# Patient Record
Sex: Male | Born: 1995 | Race: White | Hispanic: No | Marital: Married | Smoking: Former smoker
Health system: Southern US, Community
[De-identification: ages and names within clinical notes are randomized; demographics above are authoritative.]

## PROBLEM LIST (undated history)

## (undated) ENCOUNTER — Ambulatory Visit: Admission: EM

## (undated) DIAGNOSIS — D6851 Activated protein C resistance: Secondary | ICD-10-CM

## (undated) DIAGNOSIS — M5126 Other intervertebral disc displacement, lumbar region: Secondary | ICD-10-CM

## (undated) DIAGNOSIS — M4310 Spondylolisthesis, site unspecified: Secondary | ICD-10-CM

## (undated) DIAGNOSIS — H9190 Unspecified hearing loss, unspecified ear: Secondary | ICD-10-CM

## (undated) DIAGNOSIS — I10 Essential (primary) hypertension: Secondary | ICD-10-CM

## (undated) DIAGNOSIS — L409 Psoriasis, unspecified: Secondary | ICD-10-CM

## (undated) DIAGNOSIS — H9319 Tinnitus, unspecified ear: Secondary | ICD-10-CM

## (undated) HISTORY — PX: WISDOM TOOTH EXTRACTION: SHX21

## (undated) HISTORY — PX: SEPTOPLASTY: SUR1290

## (undated) HISTORY — PX: BACK SURGERY: SHX140

---

## 2019-07-18 ENCOUNTER — Ambulatory Visit
Admission: EM | Admit: 2019-07-18 | Discharge: 2019-07-18 | Disposition: A | Discharge status: Home / Self Care | Attending: Emergency Medicine | Admitting: Emergency Medicine

## 2019-07-18 DIAGNOSIS — S61210A Laceration without foreign body of right index finger without damage to nail, initial encounter: Secondary | ICD-10-CM

## 2019-07-18 MED ORDER — IBUPROFEN 600 MG PO TABS
600.0000 mg | ORAL_TABLET | Freq: Four times a day (QID) | ORAL | 0 refills | Status: AC | PRN
Start: 2019-07-18 — End: ?

## 2019-07-18 MED ORDER — BACITRACIN ZINC 500 UNIT/GM EX OINT: 1.0000 "application " | TOPICAL_OINTMENT | Freq: Once | CUTANEOUS | Status: DC

## 2019-07-18 MED ORDER — CEPHALEXIN 500 MG PO CAPS: 1000.0000 mg | ORAL_CAPSULE | Freq: Two times a day (BID) | ORAL | 0 refills | Status: AC

## 2019-07-18 NOTE — ED Provider Notes (Signed)
HPI  SUBJECTIVE:  Gabriel Rodriguez is a right handed  24 y.o. male who presents with a 1 cm laceration to the right index finger PIP sustained earlier today.  States that he was cleaning an aluminum transmission, and cut his finger on it.  States that it was contaminated with oil and organic debris.  He states that there is no rust on it.  No foreign body sensation, he reports mild pain with palpation only.  No limitation of motion of the finger, distal numbness or tingling.  No alleviating factors.  He has not tried anything for this.  Symptoms are worse with palpation.  Past medical history negative for diabetes, smoking.  Tetanus is up-to-date.  PMD: None.    History reviewed. No pertinent past medical history.  History reviewed. No pertinent surgical history.  History reviewed. No pertinent family history.  Social History   Tobacco Use  . Smoking status: Former Games developer  . Smokeless tobacco: Former Clinical biochemist  . Vaping Use: Every day  Substance Use Topics  . Alcohol use: Not on file  . Drug use: Not on file    No current facility-administered medications for this encounter.  Current Outpatient Medications:  .  cephALEXin (KEFLEX) 500 MG capsule, Take 2 capsules (1,000 mg total) by mouth 2 (two) times daily for 5 days., Disp: 20 capsule, Rfl: 0 .  ibuprofen (ADVIL) 600 MG tablet, Take 1 tablet (600 mg total) by mouth every 6 (six) hours as needed., Disp: 30 tablet, Rfl: 0  Not on File   ROS  As noted in HPI.   Physical Exam  BP 132/71 (BP Location: Right Arm)   Pulse 82   Temp 98.2 F (36.8 C) (Oral)   Resp 16   SpO2 100%   Constitutional: Well developed, well nourished, no acute distress Eyes:  EOMI, conjunctiva normal bilaterally HENT: Normocephalic, atraumatic,mucus membranes moist Respiratory: Normal inspiratory effort Cardiovascular: Normal rate GI: nondistended skin: See musculoskeletal exam Musculoskeletal: 1 cm clean linear laceration at the  right index PIP.  No foreign body seen.  Two-point discrimination distally intact.  Patient able to flex PIP and DIP against resistance.  Cap refill less than 2 seconds.        Neurologic: Alert & oriented x 3, no focal neuro deficits Psychiatric: Speech and behavior appropriate   ED Course   Medications - No data to display  Orders Placed This Encounter  Procedures  . Apply finger splint static    Index finger    Standing Status:   Standing    Number of Occurrences:   1    Order Specific Question:   Laterality    Answer:   Right    No results found for this or any previous visit (from the past 24 hour(s)). No results found.  ED Clinical Impression  1. Laceration of right index finger without foreign body without damage to nail, initial encounter      ED Assessment/Plan  Procedure note.  Had patient extensively irrigated wound out with chlorhexidine and tap water.  Then cleaned base of the finger with alcohol.  Using 1.5 cc of 1% plain lidocaine did a digital block with adequate anesthesia.  Placed 3 5-0  Interrupted Prolene sutures with close approximation of wound edges.  Bacitracin, splint placed.  Patient tolerated procedure well.  Tetanus is up-to-date.  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  States that he will be exposed to a lot of solvents.  Discussed with him that it will need to stay in a splint during this time as if he bends his finger he can tear the sutures out.  While the wound was extensively cleaned in the department, pt states it was contaminated with organic material and oil.  Will send home on Keflex 1000 mg p.o. twice daily for 5 days.  Work note for 2 days.  Primary care list for routine care.  Return here in 12 days for suture removal, sooner for any signs of infection.  Discussed  MDM, treatment plan, and plan for follow-up with patient. Discussed sn/sx that should prompt return to the ED. patient agrees with  plan.   Meds ordered this encounter  Medications  . cephALEXin (KEFLEX) 500 MG capsule    Sig: Take 2 capsules (1,000 mg total) by mouth 2 (two) times daily for 5 days.    Dispense:  20 capsule    Refill:  0  . ibuprofen (ADVIL) 600 MG tablet    Sig: Take 1 tablet (600 mg total) by mouth every 6 (six) hours as needed.    Dispense:  30 tablet    Refill:  0    *This clinic note was created using Scientist, clinical (histocompatibility and immunogenetics). Therefore, there may be occasional mistakes despite careful proofreading.   ?    Domenick Gong, MD 07/19/19 [phone]

## 2019-07-18 NOTE — Discharge Instructions (Addendum)
Keep this clean and dry for at least the next 24 to 36 hours.  Return here in 12 days for suture removal.  Finish antibiotics.  May take 600 mg of ibuprofen combined with 1000 mg of Tylenol 3-4 times a day as needed for pain.  Return here sooner for any signs of mild to moderate infection, go to the ER if your entire finger becomes swollen, red, hot, fevers above 100.4, pain not controlled with Tylenol/ibuprofen or for any other concerns.  Here is a list of primary care providers who are taking new patients:  Dr. Elizabeth Sauer [address] [place] [address] [phone]  [facility] at [facility] [address] [place], [place] [address] [phone]  [facility] [place] [address] [place] [address]  [phone]  [facility] [address] [place], [place] [address] [phone]  [facility] [address] [place]  [phone] [place], [place] [address]  Here are clinics/ other resources who will see you if you do not have insurance. Some have certain criteria that you must meet. Call them and find out what they are:  [facility]: [address] [place], [place] [address] Phone: [phone] Hours: First and Third Saturdays of each Month, 9 a.m. - 1 p.m.  [facility]: [address] [place], [place] [address] Phone: [phone] Hours: Tuesday, 4 p.m. - 8 p.m. Thursday, 1 p.m. - 8 p.m. Wednesday, 9 a.m. - [facility] [address], [place], [place] [address] Phone: [phone] Pharmacy Phone Number: [phone] Dental Phone Number: [phone] [facility] Insurance Help: [phone]  Dental Hours: Monday - Thursday, 8 a.m. - 6 p.m.  [facility] [address], [place], [place] [address] Phone: [phone] Pharmacy Phone Number: [phone] [facility] Insurance Help: [phone]  [facility] [address] [place] [place], [place]  [address] Phone: [phone] Pharmacy Phone Number: [phone] [facility] Insurance Help: [phone]  [facility] [address] [place], [place] [address] Phone: [phone] [facility] Insurance Help: [phone]   [facility] [address], [place], [place] [address] Phone: [phone]  Go to www.goodrx.com to look up your medications. This will give you a list of where you can find your prescriptions at the most affordable prices. Or ask the pharmacist what the cash price is, or if they have any other discount programs available to help make your medication more affordable. This can be less expensive than what you would pay with insurance.

## 2019-07-18 NOTE — ED Triage Notes (Signed)
Pt presents to UC for approx 1 cm laceration on right index finger. Lac is on second joint, pt concerned for wound healing r/t finger movement.  Bleeding controlled with bandage at this time.

## 2019-11-20 ENCOUNTER — Ambulatory Visit: Admission: EM | Admit: 2019-11-20 | Discharge: 2019-11-20 | Disposition: A | Discharge status: Home / Self Care

## 2019-11-20 ENCOUNTER — Other Ambulatory Visit: Payer: Self-pay

## 2019-11-20 DIAGNOSIS — H66002 Acute suppurative otitis media without spontaneous rupture of ear drum, left ear: Secondary | ICD-10-CM

## 2019-11-20 HISTORY — DX: Tinnitus, unspecified ear: H93.19

## 2019-11-20 HISTORY — DX: Unspecified hearing loss, unspecified ear: H91.90

## 2019-11-20 HISTORY — DX: Activated protein C resistance: D68.51

## 2019-11-20 HISTORY — DX: Spondylolisthesis, site unspecified: M43.10

## 2019-11-20 MED ORDER — AMOXICILLIN-POT CLAVULANATE 875-125 MG PO TABS
1.0000 | ORAL_TABLET | Freq: Two times a day (BID) | ORAL | 0 refills | Status: AC
Start: 2019-11-20 — End: 2019-11-30

## 2019-11-20 NOTE — ED Triage Notes (Signed)
Pt presents to Urgent Care with c/o tinnitus in L ear x 2 days which is causing headache. 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 Pt also reports rash to elbows and knees intermittently x 2 years.

## 2019-11-20 NOTE — ED Provider Notes (Signed)
[facility]    CSN: [id number] Arrival date & time: 11/20/19  1838      History   Chief Complaint Chief Complaint  Patient presents with   Tinnitus    HPI Gabriel Rodriguez is a 24 y.o. male.   23-year-old male here for evaluation of ringing in his left ear and a headache.  Patient reports his symptoms started 2 days ago and initially consisted of a plugged ear.  He states that the plugged sensation has resolved but he is still having ringing in his left ear.  Initially he was also a little bit off balance but that too has resolved.  Patient denies drainage from his ear, fever, runny nose or nasal congestion, cough, or other cold symptoms.  Patient has not had any recent travel.  Patient does have a history of intermittent tinnitus since serving in the [employer].     Past Medical History:  Diagnosis Date   Factor V Leiden [facility])    Hearing loss    Retrolisthesis    Tinnitus     There are no problems to display for this patient.   History reviewed. No pertinent surgical history.     Home Medications    Prior to Admission medications   Medication Sig Start Date End Date Taking? Authorizing Provider  acetaminophen (TYLENOL) 325 MG tablet Take 650 mg by mouth every 6 (six) hours as needed.   Yes [provider]  amoxicillin-clavulanate (AUGMENTIN) 875-125 MG tablet Take 1 tablet by mouth every 12 (twelve) hours for 10 days. 11/20/19 11/30/19  Becky Augusta, NP  ibuprofen (ADVIL) 600 MG tablet Take 1 tablet (600 mg total) by mouth every 6 (six) hours as needed. 07/18/19   Domenick Gong, MD    Family History Family History  Problem Relation Age of Onset   Thyroid disease Mother     Social History Social History   Tobacco Use   Smoking status: Former Smoker   Smokeless tobacco: Former Forensic psychologist Use: Every day  Substance Use Topics   Alcohol use: Yes    Comment: rarely   Drug use: Never     Allergies     Patient has no known allergies.   Review of Systems Review of Systems  Constitutional: Negative for activity change, appetite change and fever.  HENT: Positive for ear pain. Negative for congestion, ear discharge, postnasal drip, rhinorrhea and sore throat.   Respiratory: Negative for cough, shortness of breath and wheezing.   Cardiovascular: Negative for chest pain.  Gastrointestinal: Negative for diarrhea, nausea and vomiting.  Musculoskeletal: Negative for arthralgias and myalgias.  Skin: Negative for rash.  Neurological: Positive for headaches. Negative for syncope.  Hematological: Negative.   Psychiatric/Behavioral: Negative.      Physical Exam Triage Vital Signs ED Triage Vitals  Enc Vitals Group     BP 11/20/19 1911 (!) 141/95     Pulse Rate 11/20/19 1911 86     Resp 11/20/19 1911 18     Temp 11/20/19 1911 98.6 F (37 C)     Temp Source 11/20/19 1911 Oral     SpO2 11/20/19 1911 98 %     Weight 11/20/19 1909 240 lb (108.9 kg)     Height 11/20/19 1909 5\' 8"  (1.727 m)     Head Circumference --      Peak Flow --      Pain Score 11/20/19 1909 7     Pain Loc --  Pain Edu? --      Excl. in GC? --    No data found.  Updated Vital Signs BP (!) 141/95 (BP Location: Right Arm)    Pulse 86    Temp 98.6 F (37 C) (Oral)    Resp 18    Ht 5\' 8"  (1.727 m)    Wt 240 lb (108.9 kg)    SpO2 98%    BMI 36.49 kg/m   Visual Acuity Right Eye Distance:   Left Eye Distance:   Bilateral Distance:    Right Eye Near:   Left Eye Near:    Bilateral Near:     Physical Exam Vitals and nursing note reviewed.  Constitutional:      General: He is not in acute distress.    Appearance: Normal appearance. He is normal weight. He is not toxic-appearing.  HENT:     Head: Normocephalic and atraumatic.     Right Ear: Tympanic membrane, ear canal and external ear normal.     Left Ear: Ear canal and external ear normal.     Ears:     Comments: Left tympanic membrane is erythematous  and injected. Eyes:     General: No scleral icterus.    Extraocular Movements: Extraocular movements intact.     Conjunctiva/sclera: Conjunctivae normal.     Pupils: Pupils are equal, round, and reactive to light.  Cardiovascular:     Rate and Rhythm: Normal rate and regular rhythm.     Pulses: Normal pulses.     Heart sounds: Normal heart sounds. No murmur heard.  No gallop.   Pulmonary:     Effort: Pulmonary effort is normal.     Breath sounds: Normal breath sounds. No wheezing, rhonchi or rales.  Musculoskeletal:        General: No swelling or tenderness. Normal range of motion.  Skin:    General: Skin is warm and dry.     Capillary Refill: Capillary refill takes less than 2 seconds.     Findings: No erythema or lesion.  Neurological:     General: No focal deficit present.     Mental Status: He is alert and oriented to person, place, and time.  Psychiatric:        Mood and Affect: Mood normal.        Behavior: Behavior normal.        Thought Content: Thought content normal.        Judgment: Judgment normal.      UC Treatments / Results  Labs (all labs ordered are listed, but only abnormal results are displayed) Labs Reviewed - No data to display  EKG   Radiology No results found.  Procedures Procedures (including critical care time)  Medications Ordered in UC Medications - No data to display  Initial Impression / Assessment and Plan / UC Course  I have reviewed the triage vital signs and the nursing notes.  Pertinent labs & imaging results that were available during my care of the patient were reviewed by me and considered in my medical decision making (see chart for details).   Patient for evaluation of ringing in his left ear x2 days.  Patient reports that the ringing is getting to the point where it started because of a headache.  Initially his symptoms started out with muffled type hearing but that is resolved.  Physical exam reveals a injected and  erythematous left tympanic membrane.  Will DC on Augmentin twice daily x10 days.   Final Clinical  Impressions(s) / UC Diagnoses   Final diagnoses:  Non-recurrent acute suppurative otitis media of left ear without spontaneous rupture of tympanic membrane     Discharge Instructions     Take the Augmentin twice daily for 10 days with food.  Use over-the-counter ibuprofen and Tylenol as needed for pain.  You can also use a hot water bottle or heating pad underneath your pillowcase at night to help provide comfort.  Follow-up with your primary care provider for new or worsening symptoms.    ED Prescriptions    Medication Sig Dispense Auth. Provider   amoxicillin-clavulanate (AUGMENTIN) 875-125 MG tablet Take 1 tablet by mouth every 12 (twelve) hours for 10 days. 20 tablet Becky Augusta, NP     PDMP not reviewed this encounter.   Becky Augusta, NP 11/20/19 1932

## 2019-11-20 NOTE — Discharge Instructions (Addendum)
Take the Augmentin twice daily for 10 days with food.  Use over-the-counter ibuprofen and Tylenol as needed for pain.  You can also use a hot water bottle or heating pad underneath your pillowcase at night to help provide comfort.  Follow-up with your primary care provider for new or worsening symptoms.

## 2020-07-25 ENCOUNTER — Encounter: Payer: Self-pay | Admitting: Emergency Medicine

## 2020-07-25 ENCOUNTER — Other Ambulatory Visit: Payer: Self-pay

## 2020-07-25 ENCOUNTER — Ambulatory Visit
Admission: EM | Admit: 2020-07-25 | Discharge: 2020-07-25 | Disposition: A | Discharge status: Home / Self Care | Attending: Emergency Medicine | Admitting: Emergency Medicine

## 2020-07-25 DIAGNOSIS — G43009 Migraine without aura, not intractable, without status migrainosus: Secondary | ICD-10-CM

## 2020-07-25 MED ORDER — ONDANSETRON 8 MG PO TBDP
8.0000 mg | ORAL_TABLET | Freq: Once | ORAL | Status: AC
  Administered 2020-07-25: 8 mg via ORAL

## 2020-07-25 MED ORDER — KETOROLAC TROMETHAMINE 10 MG PO TABS
10.0000 mg | ORAL_TABLET | Freq: Four times a day (QID) | ORAL | 0 refills | Status: DC | PRN
Start: 2020-07-25 — End: 2023-03-07

## 2020-07-25 MED ORDER — KETOROLAC TROMETHAMINE 60 MG/2ML IM SOLN
60.0000 mg | Freq: Once | INTRAMUSCULAR | Status: AC
  Administered 2020-07-25: 60 mg via INTRAMUSCULAR

## 2020-07-25 MED ORDER — PROMETHAZINE HCL 25 MG PO TABS: 25.0000 mg | ORAL_TABLET | Freq: Four times a day (QID) | ORAL | 0 refills | Status: AC | PRN

## 2020-07-25 NOTE — Discharge Instructions (Addendum)
Go home and take Phenergan tablet to help you with the remaining nausea and go to sleep.  Hopefully this will give your brain a chance to rest and when you wake up your headache will be gone.  You can use the Toradol every 6 hours with food as needed for return of headache pain.  If you develop any worsening of your dizziness, blurry vision, sharp increase in headache pain, numbness or tingling of your extremities, or weakness you need to go to the ER for evaluation.

## 2020-07-25 NOTE — ED Triage Notes (Signed)
Pt is present today with dizziness, faint, nausea, abdominal pain lower, HA, eye irritation, loss of appetite. Pt states that sx started x2 days ago.

## 2020-07-25 NOTE — ED Provider Notes (Signed)
[facility]    CSN: [id number] Arrival date & time: 07/25/20  1334      History   Chief Complaint Chief Complaint  Patient presents with   Dizziness   Nausea   Near Syncope   Abdominal Pain    HPI Gabriel Rodriguez is a 25 y.o. male.   HPI  25 year old male here for evaluation of neurological complaints.  Patient reports that for the last 2 days he has been experiencing headache that is behind both of his eyes, room spinning, decreased appetite and nausea, vomiting, light sensitivity, room spinning, and had an episode of syncope after he stood up too quickly.  He is unsure of how long he was out or if he hit the ground, he thinks he caught himself.  Patient is also complaining of his belly feeling unsettled and not necessarily having pain as described in the triage note.  His symptoms began with dizziness and nausea and he describes it as room spinning.  He also describes it as when he looks from side to side or up and down it is as if his vision is taking a picture and everything is not transitioning smoothly.  He has not had a fever, diarrhea, numbness, tingling, or weakness in any of extremities, he denies any aura.  He has never had a headache like this previously.  He does have a history of headaches that have been going on for the past 3 years but has never been evaluated for these.  He currently rates his pain as a 3/10.  His headaches most typically are focused behind both of his eyes but occasionally are in his parietal area.  Patient did take 1500 mg of Tylenol last night without relief of pain.  He has not taken anything today.  Past Medical History:  Diagnosis Date   Factor V Leiden [facility])    Hearing loss    Retrolisthesis    Tinnitus     There are no problems to display for this patient.   History reviewed. No pertinent surgical history.     Home Medications    Prior to Admission medications   Medication Sig Start Date End Date Taking?  Authorizing Provider  ketorolac (TORADOL) 10 MG tablet Take 1 tablet (10 mg total) by mouth every 6 (six) hours as needed. 07/25/20  Yes Becky Augusta, NP  promethazine (PHENERGAN) 25 MG tablet Take 1 tablet (25 mg total) by mouth every 6 (six) hours as needed for nausea or vomiting. 07/25/20  Yes Becky Augusta, NP  acetaminophen (TYLENOL) 325 MG tablet Take 650 mg by mouth every 6 (six) hours as needed.    [provider]  ibuprofen (ADVIL) 600 MG tablet Take 1 tablet (600 mg total) by mouth every 6 (six) hours as needed. 07/18/19   Domenick Gong, MD    Family History Family History  Problem Relation Age of Onset   Thyroid disease Mother     Social History Social History   Tobacco Use   Smoking status: Former   Smokeless tobacco: Former  Building services engineer Use: Every day  Substance Use Topics   Alcohol use: Yes    Comment: rarely   Drug use: Never     Allergies   Patient has no known allergies.   Review of Systems Review of Systems  Constitutional:  Positive for appetite change. Negative for activity change and fever.  Gastrointestinal:  Positive for nausea and vomiting.  Neurological:  Positive for dizziness, syncope and  headaches. Negative for speech difficulty, weakness and numbness.  Hematological: Negative.   Psychiatric/Behavioral: Negative.      Physical Exam Triage Vital Signs ED Triage Vitals  Enc Vitals Group     BP 07/25/20 1347 (!) 141/76     Pulse Rate 07/25/20 1347 89     Resp 07/25/20 1347 17     Temp 07/25/20 1347 98.6 F (37 C)     Temp src --      SpO2 07/25/20 1347 98 %     Weight --      Height --      Head Circumference --      Peak Flow --      Pain Score 07/25/20 1346 5     Pain Loc --      Pain Edu? --      Excl. in GC? --    No data found.  Updated Vital Signs BP (!) 141/76   Pulse 89   Temp 98.6 F (37 C)   Resp 17   SpO2 98%   Visual Acuity Right Eye Distance:   Left Eye Distance:   Bilateral Distance:     Right Eye Near:   Left Eye Near:    Bilateral Near:     Physical Exam Vitals and nursing note reviewed.  Constitutional:      General: He is not in acute distress.    Appearance: Normal appearance. He is obese. He is not ill-appearing.  HENT:     Head: Normocephalic and atraumatic.     Mouth/Throat:     Mouth: Mucous membranes are moist.     Pharynx: Oropharynx is clear. No posterior oropharyngeal erythema.  Eyes:     General: No scleral icterus.    Extraocular Movements: Extraocular movements intact.     Pupils: Pupils are equal, round, and reactive to light.  Cardiovascular:     Rate and Rhythm: Normal rate and regular rhythm.     Pulses: Normal pulses.     Heart sounds: Normal heart sounds. No murmur heard.   No gallop.  Pulmonary:     Effort: Pulmonary effort is normal.     Breath sounds: Normal breath sounds. No wheezing, rhonchi or rales.  Skin:    General: Skin is warm and dry.     Capillary Refill: Capillary refill takes less than 2 seconds.     Findings: No erythema or rash.  Neurological:     General: No focal deficit present.     Mental Status: He is alert and oriented to person, place, and time.     Cranial Nerves: No cranial nerve deficit.     Sensory: No sensory deficit.     Motor: No weakness.     Coordination: Coordination normal.     Gait: Gait normal.     Deep Tendon Reflexes: Reflexes normal.  Psychiatric:        Mood and Affect: Mood normal.        Behavior: Behavior normal.        Thought Content: Thought content normal.        Judgment: Judgment normal.     UC Treatments / Results  Labs (all labs ordered are listed, but only abnormal results are displayed) Labs Reviewed - No data to display  EKG   Radiology No results found.  Procedures Procedures (including critical care time)  Medications Ordered in UC Medications  ondansetron (ZOFRAN-ODT) disintegrating tablet 8 mg (8 mg Oral Given 07/25/20 1439)  ketorolac (TORADOL)  injection 60 mg (60 mg Intramuscular Given 07/25/20 1428)    Initial Impression / Assessment and Plan / UC Course  I have reviewed the triage vital signs and the nursing notes.  Pertinent labs & imaging results that were available during my care of the patient were reviewed by me and considered in my medical decision making (see chart for details).  Patient is a very pleasant and nontoxic-appearing 25 year old male who does appear to be in moderate degree of pain here for evaluation of neurological complaints as outlined in HPI above.  Patient states that his symptoms began with dizziness and nausea as he describes as room spinning and then progressed to a headache behind both of his eyes.  He reports that this is a typical pattern for him.  Patient's physical exam reveals cranial nerves II through XII intact.  Pupils are equal round and reactive and EOM is intact.  Cardiopulmonary exam is benign.  Bilateral grips and upper extremity strength are 5/5 in lower extremity strength is 5/5.  DTRs are 1+ globally.  Will medicate patient with 60 mg of Toradol IM and 8 milligrams of Zofran p.o. and reassess.  Patient reports that his headache has resolved after the Toradol but he still is slightly nauseous.  He states that the dizziness is improving but it remains as well.  Patient was given the option of going home with a prescription for Phenergan, taking the Phenergan and getting a good night sleep and seeing if his symptoms resolve or going to the emergency department for IV Phenergan administration and further evaluation.  Patient has elected to go home.  Will discharge patient home with a prescription for Toradol as well as a prescription for Phenergan.  Patient vies that if he develops any increased nausea, his headache returns, or he develops any other visual changes that he needs to go to the ER for evaluation.   Final Clinical Impressions(s) / UC Diagnoses   Final diagnoses:  Migraine without aura  and without status migrainosus, not intractable     Discharge Instructions      Go home and take Phenergan tablet to help you with the remaining nausea and go to sleep.  Hopefully this will give your brain a chance to rest and when you wake up your headache will be gone.  You can use the Toradol every 6 hours with food as needed for return of headache pain.  If you develop any worsening of your dizziness, blurry vision, sharp increase in headache pain, numbness or tingling of your extremities, or weakness you need to go to the ER for evaluation.     ED Prescriptions     Medication Sig Dispense Auth. Provider   ketorolac (TORADOL) 10 MG tablet Take 1 tablet (10 mg total) by mouth every 6 (six) hours as needed. 20 tablet Becky Augusta, NP   promethazine (PHENERGAN) 25 MG tablet Take 1 tablet (25 mg total) by mouth every 6 (six) hours as needed for nausea or vomiting. 30 tablet Becky Augusta, NP      PDMP not reviewed this encounter.   Becky Augusta, NP 07/25/20 [id number]

## 2021-05-23 ENCOUNTER — Other Ambulatory Visit: Payer: Self-pay | Admitting: Neurology

## 2021-05-23 ENCOUNTER — Other Ambulatory Visit (HOSPITAL_BASED_OUTPATIENT_CLINIC_OR_DEPARTMENT_OTHER): Payer: Self-pay | Admitting: Neurology

## 2021-05-23 DIAGNOSIS — R519 Headache, unspecified: Secondary | ICD-10-CM

## 2021-06-01 ENCOUNTER — Ambulatory Visit
Admission: RE | Admit: 2021-06-01 | Discharge: 2021-06-01 | Disposition: A | Discharge status: Home / Self Care | Payer: No Typology Code available for payment source | Source: Ambulatory Visit | Attending: Neurology | Admitting: Neurology

## 2021-06-01 DIAGNOSIS — R519 Headache, unspecified: Secondary | ICD-10-CM | POA: Insufficient documentation

## 2022-08-19 ENCOUNTER — Ambulatory Visit
Admission: EM | Admit: 2022-08-19 | Discharge: 2022-08-19 | Disposition: A | Discharge status: Home / Self Care | Payer: No Typology Code available for payment source | Attending: Emergency Medicine | Admitting: Emergency Medicine

## 2022-08-19 DIAGNOSIS — H60502 Unspecified acute noninfective otitis externa, left ear: Secondary | ICD-10-CM | POA: Diagnosis not present

## 2022-08-19 MED ORDER — OFLOXACIN 0.3 % OT SOLN: 10.0000 [drp] | Freq: Every day | OTIC | 0 refills | Status: DC

## 2022-08-19 MED ORDER — OFLOXACIN 0.3 % OT SOLN: 10.0000 [drp] | Freq: Every day | OTIC | 0 refills | Status: AC

## 2022-08-19 NOTE — ED Triage Notes (Signed)
Patient to Urgent Care with complaints of left sided ear pain that started Monday. Discomfort when chewing. Feels like his ear is swollen shut. Headache.  Denies any known fevers. Concerned for swimmer's ear.

## 2022-08-19 NOTE — Discharge Instructions (Addendum)
Use the ear drops as directed.  Follow up with your primary care provider if your symptoms are not improving.    

## 2022-08-19 NOTE — ED Provider Notes (Signed)
Gabriel Rodriguez    CSN: [id number] Arrival date & time: 08/19/22  0834      History   Chief Complaint Chief Complaint  Patient presents with   Otalgia    HPI Gabriel Rodriguez is a 27 y.o. male.  Patient presents with left ear pain and muffled hearing x 2 days.  He has a pool at home and states this is similar to previous swimmer's ear.  He reports associated jaw pain and headache.  No fever, sore throat, cough, or other symptoms.  No treatments at home.  Medical history includes hearing loss and tinnitus.  The history is provided by the patient and medical records.    Past Medical History:  Diagnosis Date   Factor V Leiden [facility])    Hearing loss    Retrolisthesis    Tinnitus     There are no problems to display for this patient.   Past Surgical History:  Procedure Laterality Date   BACK SURGERY     SEPTOPLASTY     WISDOM TOOTH EXTRACTION         Home Medications    Prior to Admission medications   Medication Sig Start Date End Date Taking? Authorizing Provider  ofloxacin (FLOXIN) 0.3 % OTIC solution Place 10 drops into the left ear daily. 08/19/22  Yes Mickie Bail, NP  acetaminophen (TYLENOL) 325 MG tablet Take 650 mg by mouth every 6 (six) hours as needed.    [provider]  ibuprofen (ADVIL) 600 MG tablet Take 1 tablet (600 mg total) by mouth every 6 (six) hours as needed. 07/18/19   Domenick Gong, MD  ketorolac (TORADOL) 10 MG tablet Take 1 tablet (10 mg total) by mouth every 6 (six) hours as needed. 07/25/20   Becky Augusta, NP  promethazine (PHENERGAN) 25 MG tablet Take 1 tablet (25 mg total) by mouth every 6 (six) hours as needed for nausea or vomiting. 07/25/20   Becky Augusta, NP    Family History Family History  Problem Relation Age of Onset   Thyroid disease Mother     Social History Social History   Tobacco Use   Smoking status: Former   Smokeless tobacco: Former  Building services engineer status: Every Day  Substance Use  Topics   Alcohol use: Yes    Comment: rarely   Drug use: Never     Allergies   Patient has no known allergies.   Review of Systems Review of Systems  Constitutional:  Negative for chills and fever.  HENT:  Positive for ear pain. Negative for sore throat.   Respiratory:  Negative for cough and shortness of breath.      Physical Exam Triage Vital Signs ED Triage Vitals  Encounter Vitals Group     BP 08/19/22 0842 (!) 140/83     Systolic BP Percentile --      Diastolic BP Percentile --      Pulse Rate 08/19/22 0840 99     Resp 08/19/22 0840 18     Temp 08/19/22 0840 98.1 F (36.7 C)     Temp src --      SpO2 08/19/22 0840 95 %     Weight --      Height --      Head Circumference --      Peak Flow --      Pain Score 08/19/22 0839 8     Pain Loc --      Pain Education --  Exclude from Growth Chart --    No data found.  Updated Vital Signs BP (!) 140/83   Pulse 99   Temp 98.1 F (36.7 C)   Resp 18   SpO2 95%   Visual Acuity Right Eye Distance:   Left Eye Distance:   Bilateral Distance:    Right Eye Near:   Left Eye Near:    Bilateral Near:     Physical Exam Vitals and nursing note reviewed.  Constitutional:      General: He is not in acute distress.    Appearance: He is well-developed.  HENT:     Right Ear: Tympanic membrane and ear canal normal.     Left Ear: Tympanic membrane normal. Drainage and swelling present.     Nose: Nose normal.     Mouth/Throat:     Mouth: Mucous membranes are moist.     Pharynx: Oropharynx is clear.  Cardiovascular:     Rate and Rhythm: Normal rate and regular rhythm.     Heart sounds: Normal heart sounds.  Pulmonary:     Effort: Pulmonary effort is normal. No respiratory distress.     Breath sounds: Normal breath sounds.  Musculoskeletal:     Cervical back: Neck supple.  Skin:    General: Skin is warm and dry.  Neurological:     Mental Status: He is alert.      UC Treatments / Results  Labs (all labs  ordered are listed, but only abnormal results are displayed) Labs Reviewed - No data to display  EKG   Radiology No results found.  Procedures Procedures (including critical care time)  Medications Ordered in UC Medications - No data to display  Initial Impression / Assessment and Plan / UC Course  I have reviewed the triage vital signs and the nursing notes.  Pertinent labs & imaging results that were available during my care of the patient were reviewed by me and considered in my medical decision making (see chart for details).    Left otitis externa.  Treating with ofloxacin eardrops.  Education provided on otitis externa.  Instructed patient to follow-up with his PCP if his symptoms are not improving.  He agrees to plan of care.  Final Clinical Impressions(s) / UC Diagnoses   Final diagnoses:  Acute otitis externa of left ear, unspecified type     Discharge Instructions      Use the ear drops as directed.  Follow up with your primary care provider if your symptoms are not improving.        ED Prescriptions     Medication Sig Dispense Auth. Provider   ofloxacin (FLOXIN) 0.3 % OTIC solution Place 10 drops into the left ear daily. 5 mL Mickie Bail, NP      PDMP not reviewed this encounter.   Mickie Bail, NP 08/19/22 [phone]

## 2023-03-07 ENCOUNTER — Emergency Department

## 2023-03-07 ENCOUNTER — Emergency Department
Admission: EM | Admit: 2023-03-07 | Discharge: 2023-03-07 | Disposition: A | Discharge status: Home / Self Care | Attending: Emergency Medicine | Admitting: Emergency Medicine

## 2023-03-07 ENCOUNTER — Other Ambulatory Visit: Payer: Self-pay

## 2023-03-07 DIAGNOSIS — I10 Essential (primary) hypertension: Secondary | ICD-10-CM | POA: Insufficient documentation

## 2023-03-07 DIAGNOSIS — R519 Headache, unspecified: Secondary | ICD-10-CM | POA: Diagnosis present

## 2023-03-07 DIAGNOSIS — R791 Abnormal coagulation profile: Secondary | ICD-10-CM | POA: Insufficient documentation

## 2023-03-07 DIAGNOSIS — G43909 Migraine, unspecified, not intractable, without status migrainosus: Secondary | ICD-10-CM | POA: Insufficient documentation

## 2023-03-07 DIAGNOSIS — G43109 Migraine with aura, not intractable, without status migrainosus: Secondary | ICD-10-CM

## 2023-03-07 HISTORY — DX: Essential (primary) hypertension: I10

## 2023-03-07 HISTORY — DX: Psoriasis, unspecified: L40.9

## 2023-03-07 LAB — CBC WITH DIFFERENTIAL/PLATELET
Abs Immature Granulocytes: 0.02 10*3/uL (ref 0.00–0.07)
Basophils Absolute: 0 10*3/uL (ref 0.0–0.1)
Basophils Relative: 0 %
Eosinophils Absolute: 0 10*3/uL (ref 0.0–0.5)
Eosinophils Relative: 1 %
HCT: 46.6 % (ref 39.0–52.0)
Hemoglobin: 16.7 g/dL (ref 13.0–17.0)
Immature Granulocytes: 0 %
Lymphocytes Relative: 28 %
Lymphs Abs: 2 10*3/uL (ref 0.7–4.0)
MCH: 30 pg (ref 26.0–34.0)
MCHC: 35.8 g/dL (ref 30.0–36.0)
MCV: 83.7 fL (ref 80.0–100.0)
Monocytes Absolute: 0.5 10*3/uL (ref 0.1–1.0)
Monocytes Relative: 7 %
Neutro Abs: 4.5 10*3/uL (ref 1.7–7.7)
Neutrophils Relative %: 64 %
Platelets: 248 10*3/uL (ref 150–400)
RBC: 5.57 MIL/uL (ref 4.22–5.81)
RDW: 12.4 % (ref 11.5–15.5)
WBC: 7 10*3/uL (ref 4.0–10.5)
nRBC: 0 % (ref 0.0–0.2)

## 2023-03-07 LAB — COMPREHENSIVE METABOLIC PANEL
ALT: 43 U/L (ref 0–44)
AST: 34 U/L (ref 15–41)
Albumin: 5.1 g/dL — ABNORMAL HIGH (ref 3.5–5.0)
Alkaline Phosphatase: 76 U/L (ref 38–126)
Anion gap: 13 (ref 5–15)
BUN: 12 mg/dL (ref 6–20)
CO2: 22 mmol/L (ref 22–32)
Calcium: 9.3 mg/dL (ref 8.9–10.3)
Chloride: 102 mmol/L (ref 98–111)
Creatinine, Ser: 0.76 mg/dL (ref 0.61–1.24)
GFR, Estimated: >60 mL/min (ref >60)
Glucose, Bld: 98 mg/dL (ref 70–99)
Potassium: 2.9 mmol/L — ABNORMAL LOW (ref 3.5–5.1)
Sodium: 137 mmol/L (ref 135–145)
Total Bilirubin: 1.3 mg/dL — ABNORMAL HIGH (ref 0.0–1.2)
Total Protein: 7.9 g/dL (ref 6.5–8.1)

## 2023-03-07 LAB — URINE DRUG SCREEN, QUALITATIVE (ARMC ONLY)
Amphetamines, Ur Screen: NOT DETECTED
Barbiturates, Ur Screen: NOT DETECTED
Benzodiazepine, Ur Scrn: NOT DETECTED
Cannabinoid 50 Ng, Ur ~~LOC~~: POSITIVE — AB
Cocaine Metabolite,Ur ~~LOC~~: NOT DETECTED
MDMA (Ecstasy)Ur Screen: NOT DETECTED
Methadone Scn, Ur: NOT DETECTED
Opiate, Ur Screen: NOT DETECTED
Phencyclidine (PCP) Ur S: NOT DETECTED
Tricyclic, Ur Screen: NOT DETECTED

## 2023-03-07 LAB — URINALYSIS, ROUTINE W REFLEX MICROSCOPIC
Bacteria, UA: NONE SEEN
Bilirubin Urine: NEGATIVE
Glucose, UA: NEGATIVE mg/dL
Ketones, ur: 20 mg/dL — AB
Leukocytes,Ua: NEGATIVE
Nitrite: NEGATIVE
Protein, ur: NEGATIVE mg/dL
Specific Gravity, Urine: >1.046 — ABNORMAL HIGH (ref 1.005–1.030)
Squamous Epithelial / HPF: 0 /HPF (ref 0–5)
pH: 6 (ref 5.0–8.0)

## 2023-03-07 LAB — PROTIME-INR
INR: 1 (ref 0.8–1.2)
Prothrombin Time: 13 s (ref 11.4–15.2)

## 2023-03-07 LAB — ETHANOL: Alcohol, Ethyl (B): <10 mg/dL (ref <10)

## 2023-03-07 LAB — APTT: aPTT: 28 s (ref 24–36)

## 2023-03-07 MED ORDER — METOCLOPRAMIDE HCL 10 MG PO TABS: 10.0000 mg | ORAL_TABLET | Freq: Four times a day (QID) | ORAL | 0 refills | Status: AC | PRN

## 2023-03-07 MED ORDER — SODIUM CHLORIDE 0.9 % IV BOLUS
1000.0000 mL | Freq: Once | INTRAVENOUS | Status: AC
  Administered 2023-03-07: 1000 mL via INTRAVENOUS

## 2023-03-07 MED ORDER — METOCLOPRAMIDE HCL 5 MG/ML IJ SOLN
10.0000 mg | INTRAMUSCULAR | Status: AC
  Administered 2023-03-07: 10 mg via INTRAVENOUS
  Filled 2023-03-07: qty 2

## 2023-03-07 MED ORDER — IOHEXOL 350 MG/ML SOLN
75.0000 mL | Freq: Once | INTRAVENOUS | Status: AC | PRN
Start: 2023-03-07 — End: 2023-03-07
  Administered 2023-03-07: 75 mL via INTRAVENOUS

## 2023-03-07 MED ORDER — KETOROLAC TROMETHAMINE 10 MG PO TABS: 10.0000 mg | ORAL_TABLET | Freq: Four times a day (QID) | ORAL | 0 refills | Status: AC | PRN

## 2023-03-07 MED ORDER — DIPHENHYDRAMINE HCL 50 MG/ML IJ SOLN
25.0000 mg | INTRAMUSCULAR | Status: AC
  Administered 2023-03-07: 25 mg via INTRAVENOUS
  Filled 2023-03-07: qty 1

## 2023-03-07 MED ORDER — POTASSIUM CHLORIDE CRYS ER 20 MEQ PO TBCR
60.0000 meq | EXTENDED_RELEASE_TABLET | Freq: Once | ORAL | Status: AC
  Administered 2023-03-07: 60 meq via ORAL
  Filled 2023-03-07: qty 3

## 2023-03-07 MED ORDER — RIZATRIPTAN BENZOATE 5 MG PO TABS: 5.0000 mg | ORAL_TABLET | Freq: Once | ORAL | 2 refills | Status: AC | PRN

## 2023-03-07 MED ORDER — KETOROLAC TROMETHAMINE 15 MG/ML IJ SOLN
15.0000 mg | Freq: Once | INTRAMUSCULAR | Status: AC
  Administered 2023-03-07: 15 mg via INTRAVENOUS
  Filled 2023-03-07: qty 1

## 2023-03-07 NOTE — ED Notes (Addendum)
 No arm drift. Face symmetrical. EDP made aware that pt is also complaining of some vision changes. EDP at bedside to assess pt.  States for past week has been feeling bilateral hand numbness and "feeling like I'm drunk all the time" and has also been eating less and trying to lose weight and overdoing it with exercise. Hx migraines and HTN, takes amlodipine. Pt does not take blood thinners and has hx Factor V Leiden which he found out about 1.5 years ago.   L eye blurry vision started at 530pm today. HA since 1 week (whole head). Pt walks with steady gait.  States at 330pm today also began feeling dizzy and "drunk"  Code stroke to be called.

## 2023-03-07 NOTE — ED Provider Notes (Signed)
-----------------------------------------   8:16 PM on 03/07/2023 -----------------------------------------  Blood pressure (!) 152/82, pulse 86, temperature 97.9 F (36.6 C), temperature source Oral, resp. rate 17, height 5\' 9"  (1.753 m), weight 127 kg, SpO2 98%.  Assuming care from Dr. Scotty Court.  In short, Gabriel Rodriguez is a 28 y.o. male with a chief complaint of Headache .  Refer to the original H&P for additional details.  The current plan of care is to await migraine cocktail to determine patient's relief of symptoms.  Patient presented with blurred vision, dizziness left upper extremity left lower extremity paresthesias.  Patient has had CT head without, CT angio and MRI.  At this time imaging was reassuring do not feel that this was likely CVA.  This is likely complex migraine symptoms.  Neurology recommended migraine cocktail.  It is recommended to wait an hour, patient's symptoms are improving he may be discharged at that time.  If no improvement of symptoms then patient should be admitted for observation.Gabriel Rodriguez   ----------------------------------------- 8:49 PM on 03/07/2023 -----------------------------------------   Patient feels much improved after migraine cocktail.  Patient states that these migraines have been ongoing for some time, never this severe but feels back to baseline at this time.  Will prescribe Maxalt and refer to neurology for follow-up.  Concerning signs and symptoms warranting immediate return to the emergency department are discussed.    Clinical Course as of 03/07/23 2051  Gabriel Rodriguez Mar 07, 2023  1812 Pt comes to ED for migraine-type HA x 1 week,but today at 3:30 had sudden onset of feeling "drunk," dizziness, blurry vision, LUE+LLE paresthesia. NIHSS 2 for relative sensory deficit vs rightside. Reports h/o Factor V Leiden, not on AC. Will initiate code stroke [PS]  1939 Evaluation and findings discussed with neurology Dr. Selina Cooley.  CT/CT angiogram/MRI all  unremarkable, no vascular findings or dural venous sinus thrombosis.  Signs of stroke.  With symptoms consistent with a migraine syndrome, will treat with migraine cocktail, if improving he can be discharged home.  If symptoms are refractory he may need overnight observation. [PS]    Clinical Course User Index [PS] Sharman Cheek, MD      Lanette Hampshire 03/07/23 2051    Sharman Cheek, MD 03/09/23 [phone]

## 2023-03-07 NOTE — ED Notes (Signed)
 Pt back in room from MRI. Endorses severe migraine and dizziness. Reports tingling in bilateral hands but states this is better than earlier

## 2023-03-07 NOTE — Progress Notes (Signed)
 Telestroke Note    1816: Code stroke cart activated by  nursing for patient who presents with a headache x1 week. Per nursing, patient states he developed sudden onset worsening dizziness at 1530 and blurry vision at 1730. mRS 1. Patient already in CT at time of cart activation.   1817: Dr.Stack paged.   1821: Dr.Stack on camera. Patient history and report provided to Dr.Stack by nursing.   1825: Dr.Stack speaking with patient and performing neuro evaluation. LKW 1530 per Dr.Stack's neuro evaluation.  1831: Dr.Stack discussing TNK with patient at this time.   1838: Advanced imaging ordered stat before TNK treatment decision per Dr.Stack.   1849: CTA imaging complete. Patient transporting to MRI.   1852: Patient in MRI.   1903: Dr.Stack reviewing CTA and MRI imaging. Per Dr.Stack, TNK will not be administered at this time.   1904: No further needs from telestroke nurse per Dr.Stack. Logged off telestroke cart at this time.   Derrill Kay Telestroke RN

## 2023-03-07 NOTE — ED Notes (Signed)
Blue top tube sent with labs.  

## 2023-03-07 NOTE — ED Notes (Signed)
 CCMD called to place pt on cardiac monitor per order

## 2023-03-07 NOTE — Progress Notes (Signed)
   03/07/23 1817  Spiritual Encounters  Type of Visit Initial  Care provided to: Pt and family (Wife is at bedside)  Referral source Code page  Reason for visit Code  OnCall Visit Yes  Spiritual Framework  Presenting Themes Values and beliefs;Impactful experiences and emotions;Rituals and practive;Other (comment) (for Wife while Pt in scans)  Interventions  Spiritual Care Interventions Made Established relationship of care and support;Compassionate presence;Reflective listening;Narrative/life review;Explored values/beliefs/practices/strengths;Prayer  Intervention Outcomes  Outcomes Connection to spiritual care;Awareness of support;Reduced anxiety;Other (comment) (for Wife)

## 2023-03-07 NOTE — ED Notes (Signed)
 In MRI with pt for treatment decision. No TNK will be given.

## 2023-03-07 NOTE — ED Notes (Signed)
 1813: arrived to CT with pt  1817: teleneurology cart activated.

## 2023-03-07 NOTE — ED Triage Notes (Addendum)
 Pt reports headache, "my vision makes me feel like I am in a fishbowl," hypertension, and tingling in bilateral extremities x 4 days.

## 2023-03-07 NOTE — ED Notes (Signed)
Attempted blood draw x2 no success.

## 2023-03-07 NOTE — ED Notes (Signed)
 Report received by [facility]. Per report from dayshift RN, no TNK at this time. This RN resumed care of pt in MRI, pt in scanner at this time

## 2023-03-07 NOTE — ED Provider Notes (Signed)
 [facility] Provider Note    Event Date/Time   First MD Initiated Contact with Patient 03/07/23 1809     (approximate)   History   Chief Complaint: Headache   HPI  Gabriel Rodriguez is a 28 y.o. male with a history of hypertension, factor V Leiden not on anticoagulation who comes ED complaining of headache for the past week which has been persistent despite NSAIDs at home.  Today around 3:00 PM he had sudden onset of severe dizziness and feeling "drunk," along with worsening of the headache.  Also reports his vision felt blurry and distorted.  Also complains of tingling in bilateral hands.  Denies motor weakness.  Denies fever or trauma.          Physical Exam   Triage Vital Signs: ED Triage Vitals  Encounter Vitals Group     BP 03/07/23 1701 (!) 166/113     Systolic BP Percentile --      Diastolic BP Percentile --      Pulse Rate 03/07/23 1701 (!) 106     Resp 03/07/23 1701 18     Temp 03/07/23 1701 97.9 F (36.6 C)     Temp Source 03/07/23 1701 Oral     SpO2 03/07/23 1701 97 %     Weight 03/07/23 1740 280 lb (127 kg)     Height 03/07/23 1740 5\' 9"  (1.753 m)     Head Circumference --      Peak Flow --      Pain Score 03/07/23 1701 10     Pain Loc --      Pain Education --      Exclude from Growth Chart --     Most recent vital signs: Vitals:   03/07/23 1701 03/07/23 1927  BP: (!) 166/113 (!) 152/82  Pulse: (!) 106 86  Resp: 18 17  Temp: 97.9 F (36.6 C)   SpO2: 97% 98%    General: Awake, no distress.  CV:  Good peripheral perfusion.  Regular rate rhythm Resp:  Normal effort.  Abd:  No distention.  Other:  Cranial nerves III through XII intact.  Normal speech and language.  Normal orientation.  Motor function intact, no drift.  There is mild sensory deficit on the left arm and left leg compared to the right.  No neglect.  Finger-nose-finger is normal.  NIH stroke scale of 2   ED Results / Procedures / Treatments    Labs (all labs ordered are listed, but only abnormal results are displayed) Labs Reviewed  COMPREHENSIVE METABOLIC PANEL - Abnormal; Notable for the following components:      Result Value   Potassium 2.9 (*)    Albumin 5.1 (*)    Total Bilirubin 1.3 (*)    All other components within normal limits  CBC WITH DIFFERENTIAL/PLATELET  PROTIME-INR  APTT  URINE DRUG SCREEN, QUALITATIVE ([facility] ONLY)  URINALYSIS, ROUTINE W REFLEX MICROSCOPIC  ETHANOL     EKG    RADIOLOGY CT head interpreted by me, unremarkable.  Radiology report reviewed   PROCEDURES:  .Critical Care  Performed by: Sharman Cheek, MD Authorized by: Sharman Cheek, MD   Critical care provider statement:    Critical care time (minutes):  35   Critical care time was exclusive of:  Separately billable procedures and treating other patients   Critical care was necessary to treat or prevent imminent or life-threatening deterioration of the following conditions:  CNS failure or compromise   Critical care was time  spent personally by me on the following activities:  Development of treatment plan with patient or surrogate, discussions with consultants, evaluation of patient's response to treatment, examination of patient, obtaining history from patient or surrogate, ordering and performing treatments and interventions, ordering and review of laboratory studies, ordering and review of radiographic studies, pulse oximetry, re-evaluation of patient's condition and review of old charts    MEDICATIONS ORDERED IN ED: Medications  ketorolac (TORADOL) 15 MG/ML injection 15 mg (has no administration in time range)  metoCLOPramide (REGLAN) injection 10 mg (has no administration in time range)  diphenhydrAMINE (BENADRYL) injection 25 mg (has no administration in time range)  sodium chloride 0.9 % bolus 1,000 mL (has no administration in time range)  potassium chloride SA (KLOR-CON M) CR tablet 60 mEq (has no administration  in time range)  iohexol (OMNIPAQUE) 350 MG/ML injection 75 mL (75 mLs Intravenous Contrast Given 03/07/23 1850)     IMPRESSION / MDM / ASSESSMENT AND PLAN / ED COURSE  I reviewed the triage vital signs and the nursing notes.  DDx: Complicated migraine, ischemic stroke, dural sinus thrombosis, vertebral dissection, AKI, electrolyte derangement, dehydration  Patient's presentation is most consistent with acute presentation with potential threat to life or bodily function.     Clinical Course as of 03/07/23 1946  [name] Mar 07, 2023  1812 Pt comes to ED for migraine-type HA x 1 week,but today at 3:30 had sudden onset of feeling "drunk," dizziness, blurry vision, LUE+LLE paresthesia. NIHSS 2 for relative sensory deficit vs rightside. Reports h/o Factor V Leiden, not on AC. Will initiate code stroke [PS]  1939 Evaluation and findings discussed with neurology Dr. Selina Cooley.  CT/CT angiogram/MRI all unremarkable, no vascular findings or dural venous sinus thrombosis.  Signs of stroke.  With symptoms consistent with a migraine syndrome, will treat with migraine cocktail, if improving he can be discharged home.  If symptoms are refractory he may need overnight observation. [PS]    Clinical Course User Index [PS] Sharman Cheek, MD     FINAL CLINICAL IMPRESSION(S) / ED DIAGNOSES   Final diagnoses:  Complicated migraine     Rx / DC Orders   ED Discharge Orders          Ordered    ketorolac (TORADOL) 10 MG tablet  Every 6 hours PRN       Note to Pharmacy: Continuation of parenteral therapy   03/07/23 1946    metoCLOPramide (REGLAN) 10 MG tablet  Every 6 hours PRN        03/07/23 1946             Note:  This document was prepared using Dragon voice recognition software and may include unintentional dictation errors.   Sharman Cheek, MD 03/07/23 1946

## 2023-03-07 NOTE — ED Notes (Signed)
 To MRI with pt now.

## 2023-03-07 NOTE — ED Notes (Signed)
 Pt reports feeling much better and is wanting to be d/c. Provider made aware

## 2023-03-07 NOTE — ED Notes (Signed)
 Neurologist discussed treatment options with pt. Pt wants more scans before he decides. Will bring pt to MRI after CTA.

## 2023-03-07 NOTE — ED Notes (Signed)
Dr. Selina Cooley on telecart.

## 2023-03-08 NOTE — Consult Note (Signed)
 NEUROLOGY TELESTROKE CONSULT NOTE   Date of service: March 08, 2023 Patient Name: Gabriel Rodriguez MRN:  [id number] DOB:  October 16, 1995 Chief Complaint: feeling drunk Requesting Provider: Dr. Sharman Cheek  Consult Participants: myself, patient, bedside RN, telestroke RN Location of the provider: [facility], [place]  Location of the patient: [facility]  This consult was provided via telemedicine with 2-way video and audio communication. The patient/family was informed that care would be provided in this way and agreed to receive care in this manner.   History of Present Illness   This is a 28 year old gentleman with a past medical history significant for factor V Leiden deficiency, migraines, chronic vertigo who presents with acute worsening of chronic vertigo since 1530.  He had been having a headache for the past week which he describes as his typical migraines involving throbbing and pressure.  He feels like he has been "mildly drunk" for the past year but this became acutely worse at approximately 1530.  Subsequently at 1730 he developed blurry vision.  NIH stroke scale was 2 for minimal bilateral lower extremity drift that was symmetric.  Due to concern for posterior circulation stroke TNK was offered but he requested further imaging with MRI due to concern for complications at this were not a stroke.  CT head showed no acute process, CTA showed no LVO, MRI brain showed no acute ischemia.  He was therefore counseled that this was not a stroke and therefore TNK was not indicated.   LKW: 1530 Modified rankin score: 1-No significant post stroke disability and can perform usual duties with stroke symptoms IV Thrombolysis: no, MRI performed for treatment decision was negative  NIHSS = 2 for minimal symmetric BLE weakness    ROS  Comprehensive ROS performed and pertinent positives documented in HPI   Past History   Past Medical History:  Diagnosis Date  . Factor V Leiden (HCC)   . Hearing loss    . Hypertension   . Psoriasis   . Retrolisthesis   . Tinnitus     Past Surgical History:  Procedure Laterality Date  . BACK SURGERY    . SEPTOPLASTY    . WISDOM TOOTH EXTRACTION      Family History: Family History  Problem Relation Age of Onset  . Thyroid disease Mother     Social History  reports that he has quit smoking. He has quit using smokeless tobacco. He reports current alcohol use. He reports that he does not use drugs.  No Known Allergies  Medications  No current facility-administered medications for this encounter.  Current Outpatient Medications:  .  ketorolac (TORADOL) 10 MG tablet, Take 1 tablet (10 mg total) by mouth every 6 (six) hours as needed for up to 3 days for moderate pain (pain score 4-6)., Disp: 12 tablet, Rfl: 0 .  metoCLOPramide (REGLAN) 10 MG tablet, Take 1 tablet (10 mg total) by mouth every 6 (six) hours as needed., Disp: 30 tablet, Rfl: 0 .  rizatriptan (MAXALT) 5 MG tablet, Take 1 tablet (5 mg total) by mouth once as needed for migraine. May repeat in 2 hours if needed, Disp: 30 tablet, Rfl: 2 .  acetaminophen (TYLENOL) 325 MG tablet, Take 650 mg by mouth every 6 (six) hours as needed., Disp: , Rfl:  .  ibuprofen (ADVIL) 600 MG tablet, Take 1 tablet (600 mg total) by mouth every 6 (six) hours as needed., Disp: 30 tablet, Rfl: 0 .  ofloxacin (FLOXIN) 0.3 % OTIC solution, Place 10 drops into the left  ear daily., Disp: 5 mL, Rfl: 0 .  promethazine (PHENERGAN) 25 MG tablet, Take 1 tablet (25 mg total) by mouth every 6 (six) hours as needed for nausea or vomiting., Disp: 30 tablet, Rfl: 0  Vitals   Vitals:   03-12-23 1701 03-12-2023 1740 03/12/23 1927 2023/03/12 2100  BP: (!) 166/113  (!) 152/82 133/61  Pulse: (!) 106  86 78  Resp: 18  17 18   Temp: 97.9 F (36.6 C)   97.9 F (36.6 C)  TempSrc: Oral   Oral  SpO2: 97%  98% 100%  Weight:  127 kg    Height:  5\' 9"  (1.753 m)      Body mass index is 41.35 kg/m.  Physical Exam   Gen: A&O x4,  NAD Resp: normal WOB CV: extremities appear well-perfused  Neurologic Examination   *MS: A&O x4. Follows multi-step commands.  *Speech: nondysarthric, no aphasia, able to name and repeat *CN: PERRL 3mm, EOMI, VFF by confrontation, sensation intact, smile symmetric, hearing intact to voice *Motor:   Normal bulk.  No tremor, rigidity or bradykinesia. Minimal symmetric drift BLE *Sensory: SILT. Symmetric. No double-simultaneous extinction.  *Coordination:  Finger-to-nose, heel-to-shin, rapid alternating motions were intact. *Reflexes:  UTA 2/2 tele-exam *Gait: deferred  Labs/Imaging/Neurodiagnostic studies   CBC:  Recent Labs  Lab 03-12-23 1742  WBC 7.0  NEUTROABS 4.5  HGB 16.7  HCT 46.6  MCV 83.7  PLT 248   Basic Metabolic Panel:  Lab Results  Component Value Date   NA 137 March 12, 2023   K 2.9 (L) March 12, 2023   CO2 22 2023-03-12   GLUCOSE 98 Mar 12, 2023   BUN 12 Mar 12, 2023   CREATININE 0.76 03/12/23   CALCIUM 9.3 03-12-23   GFRNONAA >60 12-Mar-2023   Lipid Panel: No results found for: "LDLCALC" HgbA1c: No results found for: "HGBA1C" Urine Drug Screen:     Component Value Date/Time   LABOPIA NONE DETECTED 12-Mar-2023 2034   COCAINSCRNUR NONE DETECTED March 12, 2023 2034   LABBENZ NONE DETECTED 03/12/2023 2034   AMPHETMU NONE DETECTED 12-Mar-2023 2034   THCU POSITIVE (A) 03-12-2023 2034   LABBARB NONE DETECTED 03/12/23 2034    Alcohol Level     Component Value Date/Time   ETH <10 2023/03/12 2006   INR  Lab Results  Component Value Date   INR 1.0 2023-03-12   APTT  Lab Results  Component Value Date   APTT 28 03-12-2023   AED levels: No results found for: "PHENYTOIN", "ZONISAMIDE", "LAMOTRIGINE", "LEVETIRACETA"  CT Head without contrast: No acute process  CT angio Head and Neck with contrast: No LVO. Sufficient venous phase captured to r/o DVST per discussion with radiology  MRI Brain  No acute ischemia on DWI/ADC, FLAIR unremarkable  ASSESSMENT    This is a 28 year old gentleman with a past medical history significant for factor V Leiden deficiency, migraines, chronic vertigo who presents with acute worsening of chronic vertigo since 1530. CT head was unremarkable.  CTA showed no LVO and captured enough venous phase to rule out dural venous sinus thrombosis.  TNK was considered due to concern for posterior circulation stroke however MRI brain performed for treatment decision showed no acute ischemia therefore TNK was not indicated.  Symptoms favored to be secondary to complicated migraine.  Symptoms improved with migraine cocktail and patient feels ready for discharge.    RECOMMENDATIONS   - OK to d/c from ED with outpatient f/u with neurology ______________________________________________________________________    Signed, Jefferson Fuel, MD [facility]

## 2023-05-25 IMAGING — MR MR HEAD W/O CM
13 series · 48 of 48 positions shown · non-contrast
Comparison: No pertinent prior exams available for comparison.

CLINICAL DATA: Provided history: Chronic daily headache. Additional
history provided by scanning technologist: Patient reports frontal
headaches for 3-4 years (sometimes with associated nausea/vomiting
and pressure behind eyes).

EXAM:
MRI HEAD WITHOUT CONTRAST
TECHNIQUE: Multiplanar, multiecho pulse sequences of the brain and surrounding
structures were obtained without intravenous contrast.

[Series 5: ax dwi_tracew · axial · 3.0mm · 0.65mm/px · z∈[-73,+81]mm · 2 of 48 slices shown]
[im 1/48]
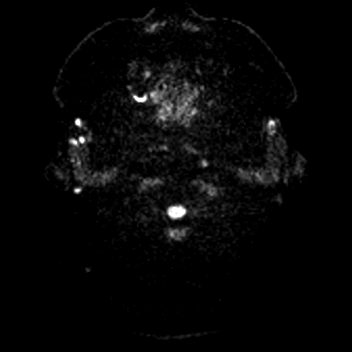
[im 48/48]
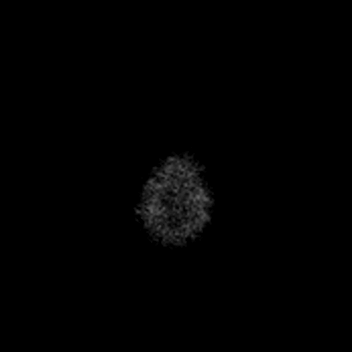

[Series 6: ax dwi_adc · axial · 3.0mm · 0.65mm/px · z∈[-73,+81]mm · 3 of 48 slices shown]
[im 1/48]
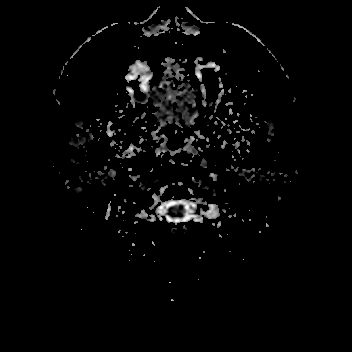
[im 24/48]
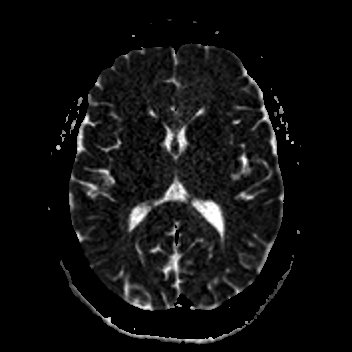
[im 48/48]
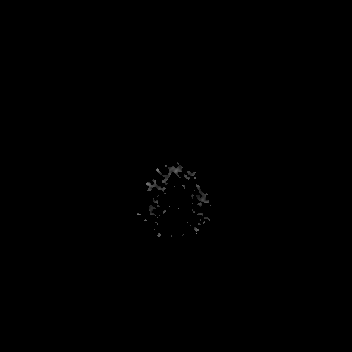

[Series 7: cor dwi_tracew · coronal · 5.0mm · 0.60mm/px · 2 of 38 slices shown]
[im 1/38]
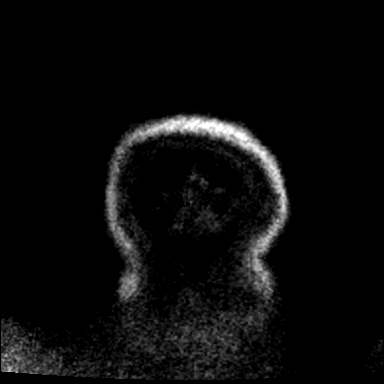
[im 38/38]
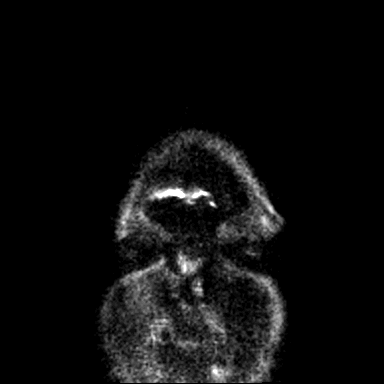

[Series 8: cor dwi_adc · coronal · 5.0mm · 0.60mm/px · 2 of 38 slices shown]
[im 1/38]
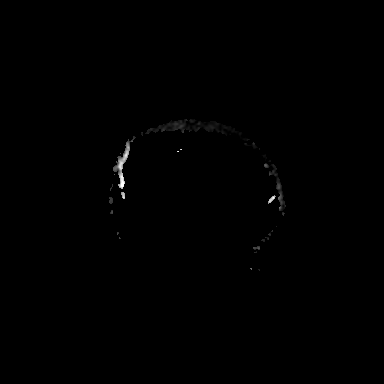
[im 38/38]
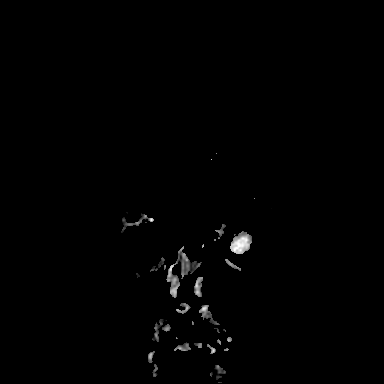

[Series 9: T1 · sagittal · 5.0mm · 0.62mm/px · 2 of 25 slices shown (1 of 2)]
[im 1/25]
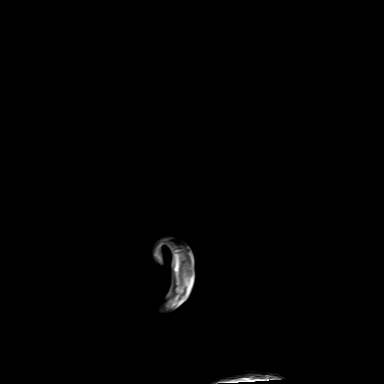
[im 25/25]
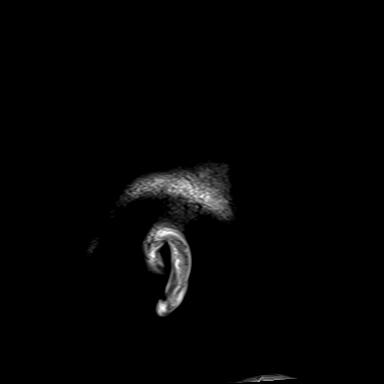

[Series 10: T2 · axial · 5.0mm · 0.53mm/px · z∈[-68,+75]mm · 2 of 25 slices shown (1 of 2)]
[im 1/25]
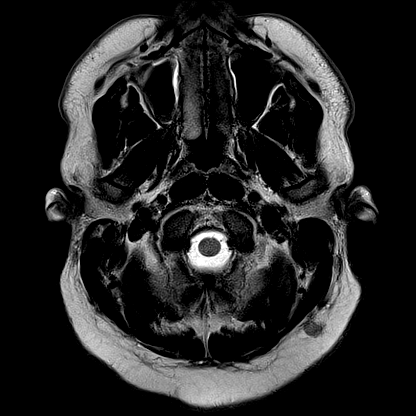
[im 25/25]
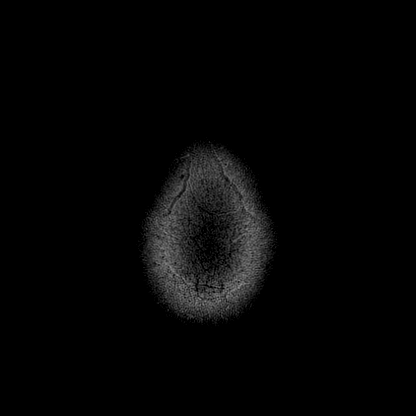

[Series 11: ax swi_mag · axial · 2.0mm · 0.90mm/px · z∈[-74,+82]mm · 5 of 80 slices shown]
[im 1/80]
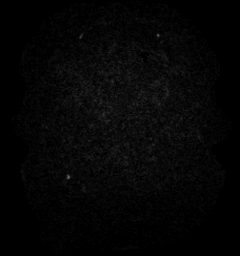
[im 20/80]
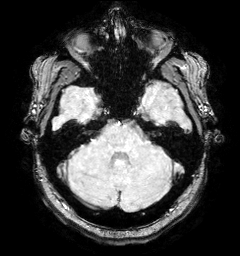
[im 40/80]
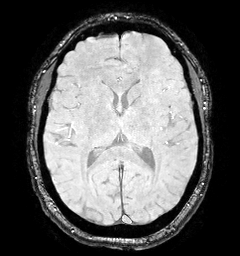
[im 60/80]
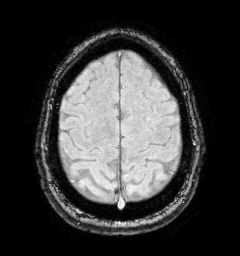
[im 80/80]
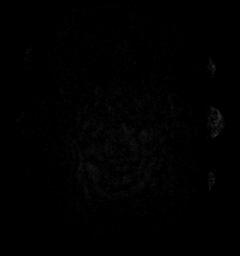

[Series 12: ax swi_pha · axial · 2.0mm · 0.90mm/px · z∈[-74,+82]mm · 5 of 80 slices shown]
[im 1/80]
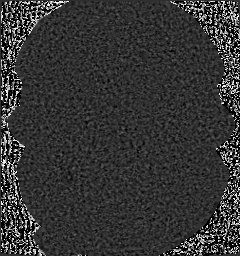
[im 20/80]
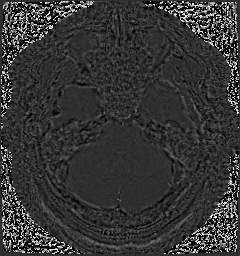
[im 40/80]
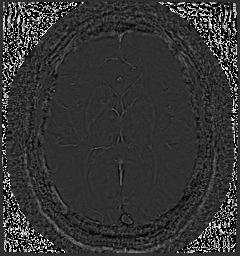
[im 60/80]
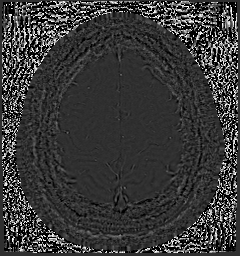
[im 80/80]
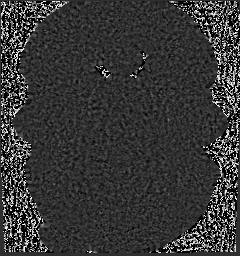

[Series 13: ax swi_swi · axial · 2.0mm · 0.90mm/px · z∈[-74,+82]mm · 5 of 80 slices shown]
[im 1/80]
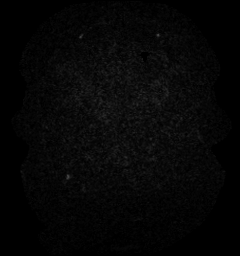
[im 20/80]
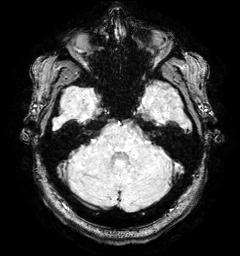
[im 40/80]
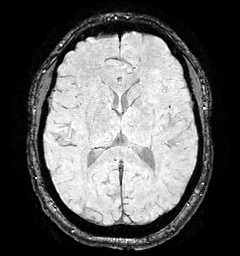
[im 60/80]
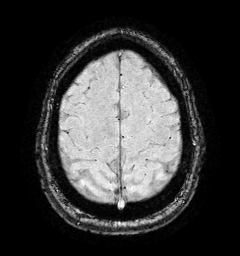
[im 80/80]
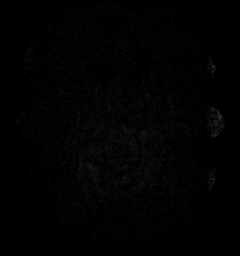

[Series 14: ax swi_swi_mip · axial · 16.0mm · 0.90mm/px · z∈[-67,+75]mm · 4 of 73 slices shown]
[im 1/73]
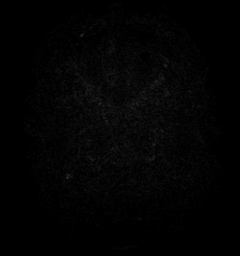
[im 25/73]
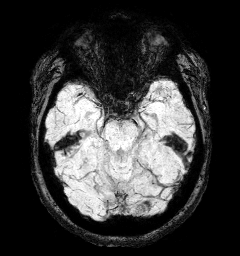
[im 49/73]
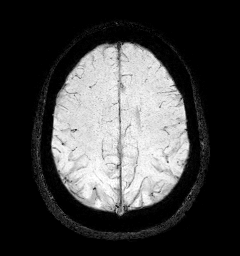
[im 73/73]
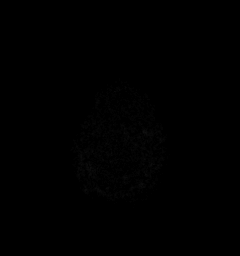

[Series 15: FLAIR · axial · 3.0mm · 0.53mm/px · z∈[-77,+84]mm · 3 of 55 slices shown]
[im 1/55]
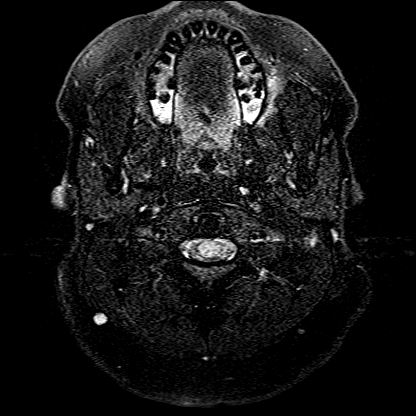
[im 28/55]
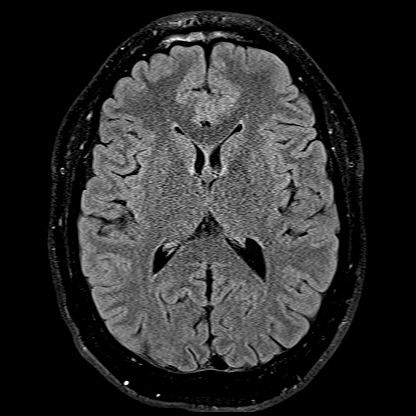
[im 55/55]
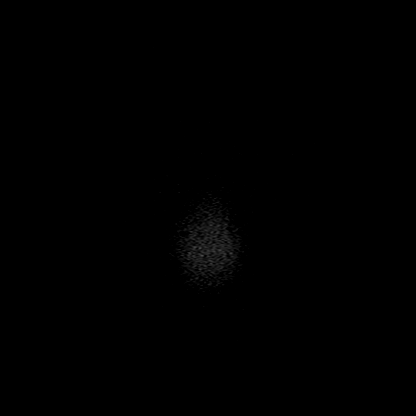

[Series 16: T1 · axial · 1.0mm · 0.98mm/px · z∈[-81,+92]mm · 11 of 176 slices shown (2 of 2)]
[im 1/176]
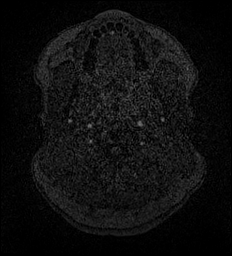
[im 18/176]
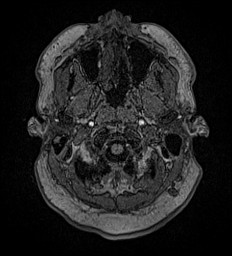
[im 36/176]
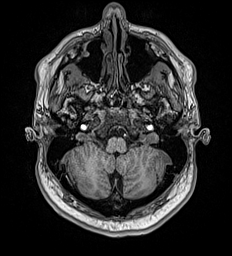
[im 53/176]
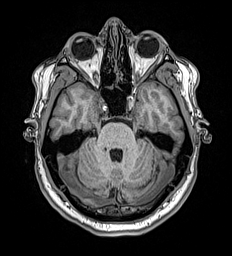
[im 71/176]
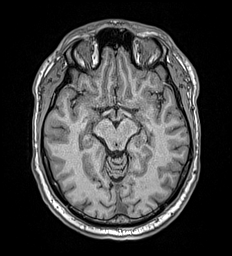
[im 88/176]
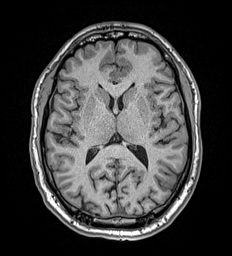
[im 106/176]
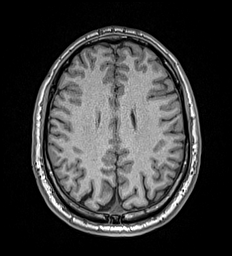
[im 123/176]
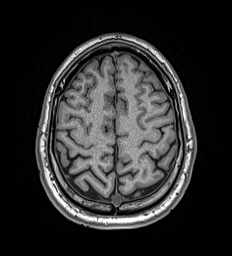
[im 141/176]
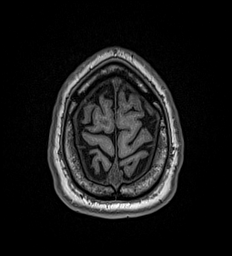
[im 158/176]
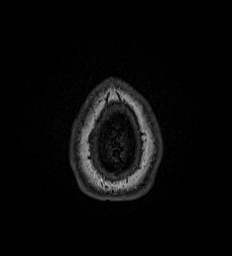
[im 176/176]
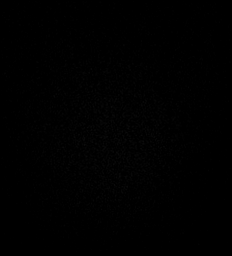

[Series 17: T2 · coronal · 5.0mm · 0.57mm/px · 2 of 29 slices shown (2 of 2)]
[im 1/29]
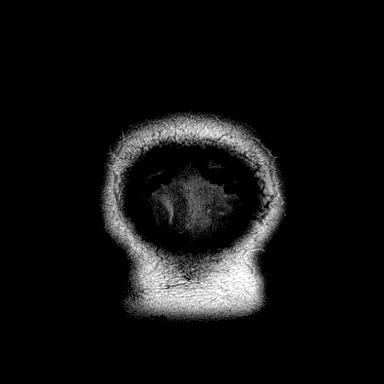
[im 29/29]
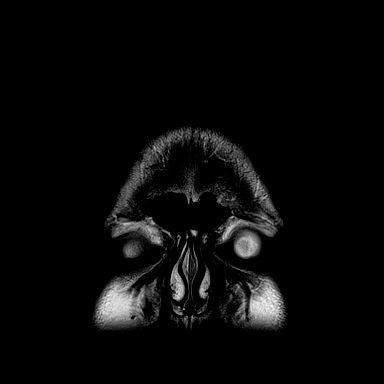

[48 of 48 positions shown; findings below may reference images not displayed]

FINDINGS: Brain:

Cerebral volume is normal.

No cortical encephalomalacia is identified. No significant cerebral
white matter disease.

There is no acute infarct.

No evidence of an intracranial mass.

No chronic intracranial blood products.

No extra-axial fluid collection.

No midline shift.

Vascular: Maintained flow voids within the proximal large arterial
vessels.

Skull and upper cervical spine: No focal suspicious marrow lesion.

Sinuses/Orbits: No mass or acute finding within the imaged orbits.
Minimal mucosal thickening within the bilateral maxillary, right
sphenoid and bilateral ethmoid sinuses.
IMPRESSION: Unremarkable non-contrast MRI appearance of the brain. No evidence
of acute intracranial abnormality.

Mild mucosal thickening within the paranasal sinuses.
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
## 2023-07-13 ENCOUNTER — Encounter: Payer: Self-pay | Admitting: Psychiatry

## 2023-07-13 ENCOUNTER — Ambulatory Visit (INDEPENDENT_AMBULATORY_CARE_PROVIDER_SITE_OTHER): Admitting: Psychiatry

## 2023-07-13 ENCOUNTER — Other Ambulatory Visit: Payer: Self-pay

## 2023-07-13 VITALS — BP 124/82 | HR 98 | Temp 96.6°F | Ht 69.0 in | Wt 284.0 lb

## 2023-07-13 DIAGNOSIS — F331 Major depressive disorder, recurrent, moderate: Secondary | ICD-10-CM

## 2023-07-13 DIAGNOSIS — F410 Panic disorder [episodic paroxysmal anxiety] without agoraphobia: Secondary | ICD-10-CM

## 2023-07-13 DIAGNOSIS — F4310 Post-traumatic stress disorder, unspecified: Secondary | ICD-10-CM | POA: Diagnosis not present

## 2023-07-13 MED ORDER — PRAZOSIN HCL 2 MG PO CAPS: 2.0000 mg | ORAL_CAPSULE | Freq: Every day | ORAL | 0 refills | Status: DC

## 2023-07-13 MED ORDER — PRAZOSIN HCL 1 MG PO CAPS: 1.0000 mg | ORAL_CAPSULE | Freq: Every day | ORAL | 0 refills | Status: DC

## 2023-07-13 NOTE — Patient Instructions (Addendum)
 Continue sertraline 50 mg daily  Start prazosin  1 mg at night for 3 days, then 2 mg at night Referral for therapy, [place] Next appointment: 8/7 at 4:30

## 2023-08-07 NOTE — Progress Notes (Unsigned)
 No show

## 2023-08-12 ENCOUNTER — Ambulatory Visit (INDEPENDENT_AMBULATORY_CARE_PROVIDER_SITE_OTHER): Payer: Self-pay | Admitting: Psychiatry

## 2023-08-12 DIAGNOSIS — Z91199 Patient's noncompliance with other medical treatment and regimen due to unspecified reason: Secondary | ICD-10-CM

## 2023-08-27 ENCOUNTER — Ambulatory Visit (INDEPENDENT_AMBULATORY_CARE_PROVIDER_SITE_OTHER): Payer: Self-pay | Admitting: Licensed Clinical Social Worker

## 2023-08-27 ENCOUNTER — Encounter: Payer: Self-pay | Admitting: Licensed Clinical Social Worker

## 2023-08-27 DIAGNOSIS — F4310 Post-traumatic stress disorder, unspecified: Secondary | ICD-10-CM
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
## 2023-09-10 ENCOUNTER — Ambulatory Visit: Admitting: Licensed Clinical Social Worker

## 2023-09-17 ENCOUNTER — Ambulatory Visit: Admitting: Psychiatry

## 2023-09-17 ENCOUNTER — Ambulatory Visit (INDEPENDENT_AMBULATORY_CARE_PROVIDER_SITE_OTHER): Payer: Self-pay | Admitting: Licensed Clinical Social Worker

## 2023-09-17 DIAGNOSIS — F4310 Post-traumatic stress disorder, unspecified: Secondary | ICD-10-CM

## 2023-09-17 DIAGNOSIS — F331 Major depressive disorder, recurrent, moderate: Secondary | ICD-10-CM | POA: Diagnosis not present

## 2023-09-17 DIAGNOSIS — F410 Panic disorder [episodic paroxysmal anxiety] without agoraphobia: Secondary | ICD-10-CM

## 2023-09-17 MED ORDER — PRAZOSIN HCL 2 MG PO CAPS: 2.0000 mg | ORAL_CAPSULE | Freq: Every day | ORAL | 2 refills | Status: DC

## 2023-09-17 MED ORDER — SERTRALINE HCL 100 MG PO TABS: 50.0000 mg | ORAL_TABLET | Freq: Every day | ORAL | 2 refills | Status: DC

## 2023-09-17 NOTE — Progress Notes (Unsigned)
 [facility] Counselor/Therapist Progress Note  Patient ID: Gabriel Rodriguez, MRN: [id number]    Date: 09/17/23  Time Spent: 0918  am - 0958 am : 40 Minutes  Treatment Type: Individual Therapy.  Reported Symptoms: Patient reports that he has PTSD from [employer]. Anxiety, depression, depersonalization.   Mental Status Exam: Appearance:  NA-Pt did not use camera on his video     Behavior: Appropriate  Motor: NA  Speech/Language:  Clear and Coherent  Affect:   Mood: anxious  Thought process: normal  Thought content:   WNL  Sensory/Perceptual disturbances:   WNL  Orientation: oriented to person, place, time/date, situation, day of week, month of year, and year  Attention: Good  Concentration: Good  Memory: WNL  Fund of knowledge:  Good  Insight:   Fair  Judgment:  Fair  Impulse Control: Fair   Risk Assessment: Danger to Self:  No Self-injurious Behavior: No Danger to Others: No Duty to Warn:no Physical Aggression / Violence:No  Access to Firearms a concern: No  Gang Involvement:No   Subjective:   [name] participated from home, via video. Patient is aware of risk and limitations, and consented to treatment. Therapist participated from home office. We met online due to patient request.  Olanrewaju presented late for his session reporting that he has not seen an improvement in symptoms. Ragnar reports that he continues to struggle to get to work on time. Patient reports continuing to feel anxious and a lack of interest and increased depression. He reports that he struggles and doesn't like being late for work on a daily basis. He reports that his boss is very understanding and works with him as he understands his circumstances.  Clinician provided support and encouragement via active listening and verbal interaction. Clinician processed with patient setting a routine to help him get in bed at a regular time as well as refraining from screen time.  Patient reports that sleeping is a challenge at times and he has a difficult time waking up. Clinician also discussed with patient the importance of letting his provider know about the struggles to see if there needs to be a medication adjustment. Clinician processed with patient calming techniques such as deep breathing and grounding to assist with his anxiety.   Kainalu was fully engages in his session and and was motivated for treatment as identified through his interaction and reporting a desire to be like he was before [employer]. Patient is to use CBT, mindfulness and coping skills to help manage decrease symptoms associated with their diagnosis. Treatment planning to be reviewed by 08/26/2024.  Interventions: Cognitive Behavioral Therapy, Dialectical Behavioral Therapy, Mindfulness Meditation, Motivational Interviewing, Solution-Oriented/Positive Psychology, and Insight-Oriented  Diagnosis: PTSD   Damien Junk MSW, LCSW/DATE 09/17/2023
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
## 2023-10-05 ENCOUNTER — Ambulatory Visit: Admitting: Licensed Clinical Social Worker

## 2023-10-06 ENCOUNTER — Emergency Department

## 2023-10-06 ENCOUNTER — Other Ambulatory Visit: Payer: Self-pay

## 2023-10-06 ENCOUNTER — Emergency Department
Admission: EM | Admit: 2023-10-06 | Discharge: 2023-10-06 | Disposition: A | Discharge status: Home / Self Care | Attending: Emergency Medicine | Admitting: Emergency Medicine

## 2023-10-06 DIAGNOSIS — S29012A Strain of muscle and tendon of back wall of thorax, initial encounter: Secondary | ICD-10-CM | POA: Insufficient documentation

## 2023-10-06 DIAGNOSIS — S29019A Strain of muscle and tendon of unspecified wall of thorax, initial encounter: Secondary | ICD-10-CM

## 2023-10-06 DIAGNOSIS — S29002A Unspecified injury of muscle and tendon of back wall of thorax, initial encounter: Secondary | ICD-10-CM | POA: Diagnosis present

## 2023-10-06 DIAGNOSIS — X58XXXA Exposure to other specified factors, initial encounter: Secondary | ICD-10-CM | POA: Insufficient documentation

## 2023-10-06 DIAGNOSIS — I10 Essential (primary) hypertension: Secondary | ICD-10-CM | POA: Diagnosis not present

## 2023-10-06 HISTORY — DX: Other intervertebral disc displacement, lumbar region: M51.26

## 2023-10-06 MED ORDER — CYCLOBENZAPRINE HCL 10 MG PO TABS: 10.0000 mg | ORAL_TABLET | Freq: Three times a day (TID) | ORAL | 0 refills | Status: DC | PRN

## 2023-10-06 MED ORDER — KETOROLAC TROMETHAMINE 15 MG/ML IJ SOLN
15.0000 mg | Freq: Once | INTRAMUSCULAR | Status: AC
  Administered 2023-10-06: 15 mg via INTRAMUSCULAR
  Filled 2023-10-06: qty 1

## 2023-10-06 MED ORDER — PREDNISONE 10 MG (21) PO TBPK: ORAL_TABLET | ORAL | 0 refills | Status: AC

## 2023-10-06 NOTE — ED Triage Notes (Signed)
 Patient reports mid back pain, denies injury and/or trauma; history of herniated disc.

## 2023-10-06 NOTE — ED Provider Notes (Signed)
 [facility] Provider Note    Event Date/Time   First MD Initiated Contact with Patient 10/06/23 1232     (approximate)   History   Back Pain   HPI  Gabriel Rodriguez is a 28 y.o. male factor V, hypertension, retrolisthesis of the lower spine, herniated lumbar disc presents emergency department with mid back pain.  Patient was changing out a transmission.  Has multiple issues with his lower back.  States this is now in the mid back.  Thought it was a muscle strain but has not gotten any better.  He is concerned he may have another herniated disc denies loss of bowel or bladder control.      Physical Exam   Triage Vital Signs: ED Triage Vitals  Encounter Vitals Group     BP 10/06/23 1148 127/80     Girls Systolic BP Percentile --      Girls Diastolic BP Percentile --      Boys Systolic BP Percentile --      Boys Diastolic BP Percentile --      Pulse Rate 10/06/23 1148 79     Resp 10/06/23 1148 18     Temp 10/06/23 1148 97.6 F (36.4 C)     Temp Source 10/06/23 1148 Oral     SpO2 10/06/23 1148 99 %     Weight 10/06/23 1147 280 lb (127 kg)     Height 10/06/23 1147 5' 8 (1.727 m)     Head Circumference --      Peak Flow --      Pain Score 10/06/23 1147 9     Pain Loc --      Pain Education --      Exclude from Growth Chart --     Most recent vital signs: Vitals:   10/06/23 1148 10/06/23 1436  BP: 127/80 118/70  Pulse: 79 67  Resp: 18   Temp: 97.6 F (36.4 C) 98.5 F (36.9 C)  SpO2: 99% 96%     General: Awake, no distress.   CV:  Good peripheral perfusion. Resp:  Normal effort.  Abd:  No distention.   Other:  Lower T-spine tender to palpation, patient is able to roll over without difficulty, 5 out of 5 strength lower extremity   ED Results / Procedures / Treatments   Labs (all labs ordered are listed, but only abnormal results are displayed) Labs Reviewed - No data to display   EKG     RADIOLOGY X-ray  T-spine    PROCEDURES:   Procedures  Critical Care:  no Chief Complaint  Patient presents with   Back Pain      MEDICATIONS ORDERED IN ED: Medications  ketorolac  (TORADOL ) 15 MG/ML injection 15 mg (15 mg Intramuscular Given 10/06/23 1340)     IMPRESSION / MDM / ASSESSMENT AND PLAN / ED COURSE  I reviewed the triage vital signs and the nursing notes.                              Differential diagnosis includes, but is not limited to, muscle strain, bulging disc, fracture, contusion  Patient's presentation is most consistent with acute illness / injury with system symptoms.    Medications given: Toradol  15 mg IM  X-ray T-spine  X-ray T-spine independent reviewed interpreted by me as being negative for acute abnormality  I did explain findings to the patient.  States he still has some pain  but did have a little relief with Toradol .  Since his symptoms have been ongoing for more than a week we will go ahead and treat him with Flexeril and Sterapred.  [facility] needs [facility] later.  Patient is in agreement treatment plan.  Work note provided.  Discharged stable condition.      FINAL CLINICAL IMPRESSION(S) / ED DIAGNOSES   Final diagnoses:  Thoracic myofascial strain, initial encounter     Rx / DC Orders   ED Discharge Orders          Ordered    cyclobenzaprine (FLEXERIL) 10 MG tablet  3 times daily PRN        10/06/23 1426    predniSONE (STERAPRED UNI-PAK 21 TAB) 10 MG (21) TBPK tablet        10/06/23 1426             Note:  This document was prepared using Dragon voice recognition software and may include unintentional dictation errors.    Gasper Devere ORN, PA-C 10/06/23 1610    Waymond Lorelle Cummins, MD 10/06/23 [phone]

## 2023-10-08 ENCOUNTER — Telehealth: Admitting: Psychiatry

## 2023-10-26 ENCOUNTER — Ambulatory Visit: Admitting: Licensed Clinical Social Worker

## 2023-11-03 ENCOUNTER — Ambulatory Visit
Admission: EM | Admit: 2023-11-03 | Discharge: 2023-11-03 | Disposition: A | Discharge status: Home / Self Care | Attending: Emergency Medicine | Admitting: Emergency Medicine

## 2023-11-03 ENCOUNTER — Encounter: Payer: Self-pay | Admitting: Emergency Medicine

## 2023-11-03 DIAGNOSIS — Z23 Encounter for immunization: Secondary | ICD-10-CM

## 2023-11-03 DIAGNOSIS — S61213A Laceration without foreign body of left middle finger without damage to nail, initial encounter: Secondary | ICD-10-CM

## 2023-11-03 MED ORDER — TETANUS-DIPHTH-ACELL PERTUSSIS 5-2-15.5 LF-MCG/0.5 IM SUSP
0.5000 mL | Freq: Once | INTRAMUSCULAR | Status: AC
  Administered 2023-11-03: 0.5 mL via INTRAMUSCULAR

## 2023-11-03 NOTE — ED Provider Notes (Signed)
 Gabriel Rodriguez    CSN: [id number] Arrival date & time: 11/03/23  1204      History   Chief Complaint Chief Complaint  Patient presents with   Laceration    HPI Gabriel Rodriguez is a 28 y.o. male.   Patient presents for evaluation of a laceration present to the left middle finger that occurred today while at work.  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  Last tetanus 2018.  Has covered with a dressing to attempt to control bleeding.   Past Medical History:  Diagnosis Date   Factor V Leiden    Hearing loss    Hypertension    Lumbar herniated disc    Psoriasis    Retrolisthesis    Tinnitus     There are no active problems to display for this patient.   Past Surgical History:  Procedure Laterality Date   BACK SURGERY     SEPTOPLASTY     WISDOM TOOTH EXTRACTION         Home Medications    Prior to Admission medications   Medication Sig Start Date End Date Taking? Authorizing Provider  acetaminophen (TYLENOL) 325 MG tablet Take 650 mg by mouth every 6 (six) hours as needed.    [provider]  cyclobenzaprine (FLEXERIL) 10 MG tablet Take 1 tablet (10 mg total) by mouth 3 (three) times daily as needed. 10/06/23   Fisher, Devere ORN, PA-C  ibuprofen  (ADVIL ) 600 MG tablet Take 1 tablet (600 mg total) by mouth every 6 (six) hours as needed. 07/18/19   Mortenson, Ashley, MD  lisinopril (ZESTRIL) 10 MG tablet Take 10 mg by mouth daily.    [provider]  metoCLOPramide  (REGLAN ) 10 MG tablet Take 1 tablet (10 mg total) by mouth every 6 (six) hours as needed. 03/07/23   Viviann Pastor, MD  ofloxacin  (FLOXIN ) 0.3 % OTIC solution Place 10 drops into the left ear daily. Patient not taking: Reported on 07/13/2023 08/19/22   Corlis Burnard DEL, NP  prazosin  (MINIPRESS ) 2 MG capsule Take 1 capsule (2 mg total) by mouth at bedtime. Start after 1 mg at night for 3 days 09/17/23 10/17/23  Saucier, Dorn Ruth, NP  predniSONE (STERAPRED UNI-PAK 21  TAB) 10 MG (21) TBPK tablet Take 6 pills on day one then decrease by 1 pill each day 10/06/23   Gasper Devere ORN, PA-C  promethazine  (PHENERGAN ) 25 MG tablet Take 1 tablet (25 mg total) by mouth every 6 (six) hours as needed for nausea or vomiting. Patient not taking: Reported on 07/13/2023 07/25/20   Bernardino Ditch, NP  risankizumab-rzaa [facility] PEN) 150 MG/ML pen Inject 150 mg into the skin as directed.    [provider]  rizatriptan  (MAXALT ) 5 MG tablet Take 1 tablet (5 mg total) by mouth once as needed for migraine. May repeat in 2 hours if needed 03/07/23 03/07/24  Cuthriell, Jonathan D, PA-C  sertraline  (ZOLOFT ) 100 MG tablet Take 0.5 tablets (50 mg total) by mouth daily. 09/17/23   Saucier, Dorn Ruth, NP  verapamil (CALAN) 120 MG tablet Take 120 mg by mouth once.    [provider]    Family History Family History  Problem Relation Age of Onset   Thyroid disease Mother     Social History Social History   Tobacco Use   Smoking status: Former   Smokeless tobacco: Former  Building Services Engineer status: Former  Substance Use Topics   Alcohol use: Yes  Comment: rarely   Drug use: Never     Allergies   Patient has no known allergies.   Review of Systems Review of Systems   Physical Exam Triage Vital Signs ED Triage Vitals  Encounter Vitals Group     BP 11/03/23 1236 116/75     Girls Systolic BP Percentile --      Girls Diastolic BP Percentile --      Boys Systolic BP Percentile --      Boys Diastolic BP Percentile --      Pulse Rate 11/03/23 1236 79     Resp 11/03/23 1236 18     Temp 11/03/23 1236 98.7 F (37.1 C)     Temp Source 11/03/23 1236 Oral     SpO2 11/03/23 1236 96 %     Weight --      Height --      Head Circumference --      Peak Flow --      Pain Score 11/03/23 1232 0     Pain Loc --      Pain Education --      Exclude from Growth Chart --    No data found.  Updated Vital Signs BP 116/75 (BP Location: Left Arm)   Pulse 79    Temp 98.7 F (37.1 C) (Oral)   Resp 18   SpO2 96%   Visual Acuity Right Eye Distance:   Left Eye Distance:   Bilateral Distance:    Right Eye Near:   Left Eye Near:    Bilateral Near:     Physical Exam Constitutional:      Appearance: Normal appearance.  Eyes:     Extraocular Movements: Extraocular movements intact.  Pulmonary:     Effort: Pulmonary effort is normal.  Skin:    Comments: 2 cm laceration across the middle phalanx of the left middle finger without involvement of the joint, bleeding controlled, able to complete full range of motion, sensation intact  Neurological:     Mental Status: He is alert and oriented to person, place, and time. Mental status is at baseline.      UC Treatments / Results  Labs (all labs ordered are listed, but only abnormal results are displayed) Labs Reviewed - No data to display  EKG   Radiology No results found.  Procedures Laceration Repair  Date/Time: 11/03/2023 1:13 PM  Performed by: Teresa Shelba SAUNDERS, NP Authorized by: Teresa Shelba SAUNDERS, NP   Consent:    Consent obtained:  Verbal   Consent given by:  Patient   Risks, benefits, and alternatives were discussed: yes     Risks discussed:  Pain and infection Universal protocol:    Procedure explained and questions answered to patient or proxy's satisfaction: yes     Patient identity confirmed:  Verbally with patient Anesthesia:    Anesthesia method:  Local infiltration   Local anesthetic:  Lidocaine 1% w/o epi Laceration details:    Location:  Finger   Finger location:  L long finger   Length (cm):  2   Depth (mm):  0.5 Exploration:    Wound exploration: entire depth of wound visualized   Treatment:    Wound cleansed with: dermal wound cleanser. Skin repair:    Repair method:  Sutures   Suture size:  5-0 and 3-0   Suture material:  Prolene   Suture technique:  Simple interrupted   Number of sutures:  5 Approximation:    Approximation:  Close Repair type:  Repair type:  Simple Post-procedure details:    Dressing:  Non-adherent dressing   Procedure completion:  Tolerated  (including critical care time)  Medications Ordered in UC Medications  Tdap (ADACEL) injection 0.5 mL (0.5 mLs Intramuscular Given 11/03/23 1300)    Initial Impression / Assessment and Plan / UC Course  I have reviewed the triage vital signs and the nursing notes.  Pertinent labs & imaging results that were available during my care of the patient were reviewed by me and considered in my medical decision making (see chart for details).  Laceration of the left middle finger without damage to the nail without foreign body  5 sutures placed advise removal in 10 to 14 days recommended daily cleansing and covering if at risk for contamination, advised to monitor and may use over-the-counter medications for pain, advised to return for any signs of infection Final Clinical Impressions(s) / UC Diagnoses   Final diagnoses:  Laceration of left middle finger without foreign body without damage to nail, initial encounter     Discharge Instructions      Today you are evaluated for the laceration to your finger  5 sutures placed, please return in 10 to 14 days for removal  Tetanus has been updated, good for 10 years (2035)  Clean over the affected area once a day with unscented soap and water and then as needed if dirty, pat do not rub, may apply topical antibiotic ointment over the affected area per preference  If completing any activity in which the area may become dirty please cover with a nonstick Band-Aid and wear gloves  Take Tylenol and or Motrin  as needed for any discomfort  Please follow-up if you start to see redness, swelling or puslike drainage as these are signs of infection  Please follow-up for reevaluation for any delays in healing   ED Prescriptions   None    PDMP not reviewed this encounter.   Teresa Shelba SAUNDERS, NP 11/03/23 1314

## 2023-11-03 NOTE — ED Triage Notes (Signed)
 Patient reports laceration to left middle finger that happened today. Bleeding controlled with dressing. Patient reports he cut it on a rusty grate. Patient unsure of last tetanus shot at this time. Denies pain at this time.

## 2023-11-03 NOTE — Discharge Instructions (Signed)
 Today you are evaluated for the laceration to your finger  5 sutures placed, please return in 10 to 14 days for removal  Tetanus has been updated, good for 10 years (2035)  Clean over the affected area once a day with unscented soap and water and then as needed if dirty, pat do not rub, may apply topical antibiotic ointment over the affected area per preference  If completing any activity in which the area may become dirty please cover with a nonstick Band-Aid and wear gloves  Take Tylenol and or Motrin  as needed for any discomfort  Please follow-up if you start to see redness, swelling or puslike drainage as these are signs of infection  Please follow-up for reevaluation for any delays in healing

## 2023-11-03 NOTE — ED Notes (Signed)
 Patient last tetanus was January 30, 2016.

## 2023-11-22 ENCOUNTER — Other Ambulatory Visit: Payer: Self-pay | Admitting: Psychiatry

## 2023-11-22 ENCOUNTER — Telehealth: Payer: Self-pay | Admitting: Psychiatry

## 2023-11-22 MED ORDER — SERTRALINE HCL 100 MG PO TABS: 50.0000 mg | ORAL_TABLET | Freq: Every day | ORAL | 2 refills | Status: DC

## 2023-11-22 MED ORDER — PRAZOSIN HCL 2 MG PO CAPS: 2.0000 mg | ORAL_CAPSULE | Freq: Every day | ORAL | 2 refills | Status: AC

## 2023-11-22 NOTE — Telephone Encounter (Signed)
 Patient schedule follow up for 11-26-23. States he needs a refill on sertraline  100 mg. Also states he needs prescription for prazosin  HCL 2 mg please review and  Send to [facility]

## 2023-11-26 ENCOUNTER — Telehealth (INDEPENDENT_AMBULATORY_CARE_PROVIDER_SITE_OTHER): Admitting: Psychiatry

## 2023-11-26 DIAGNOSIS — F331 Major depressive disorder, recurrent, moderate: Secondary | ICD-10-CM

## 2023-11-26 DIAGNOSIS — F4310 Post-traumatic stress disorder, unspecified: Secondary | ICD-10-CM | POA: Diagnosis not present

## 2023-11-26 DIAGNOSIS — F410 Panic disorder [episodic paroxysmal anxiety] without agoraphobia: Secondary | ICD-10-CM | POA: Diagnosis not present

## 2023-11-26 MED ORDER — SERTRALINE HCL 100 MG PO TABS: 50.0000 mg | ORAL_TABLET | Freq: Every day | ORAL | 2 refills | Status: DC
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
## 2023-11-30 ENCOUNTER — Other Ambulatory Visit: Payer: Self-pay | Admitting: Psychiatry

## 2023-11-30 MED ORDER — SERTRALINE HCL 100 MG PO TABS: 50.0000 mg | ORAL_TABLET | Freq: Every day | ORAL | 2 refills | Status: DC

## 2023-12-10 ENCOUNTER — Telehealth: Admitting: Psychiatry

## 2023-12-10 DIAGNOSIS — F4310 Post-traumatic stress disorder, unspecified: Secondary | ICD-10-CM

## 2023-12-10 DIAGNOSIS — F331 Major depressive disorder, recurrent, moderate: Secondary | ICD-10-CM

## 2023-12-10 MED ORDER — SERTRALINE HCL 100 MG PO TABS: 100.0000 mg | ORAL_TABLET | Freq: Every day | ORAL | 1 refills | Status: AC
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
## 2023-12-22 ENCOUNTER — Ambulatory Visit (HOSPITAL_COMMUNITY): Admitting: Psychiatry

## 2024-01-28 ENCOUNTER — Emergency Department
Admission: EM | Admit: 2024-01-28 | Discharge: 2024-01-28 | Disposition: A | Discharge status: Home / Self Care | Attending: Emergency Medicine | Admitting: Emergency Medicine

## 2024-01-28 ENCOUNTER — Emergency Department

## 2024-01-28 ENCOUNTER — Other Ambulatory Visit: Payer: Self-pay

## 2024-01-28 DIAGNOSIS — M25561 Pain in right knee: Secondary | ICD-10-CM | POA: Insufficient documentation

## 2024-01-28 DIAGNOSIS — Y92410 Unspecified street and highway as the place of occurrence of the external cause: Secondary | ICD-10-CM | POA: Diagnosis not present

## 2024-01-28 DIAGNOSIS — Y990 Civilian activity done for income or pay: Secondary | ICD-10-CM | POA: Insufficient documentation

## 2024-01-28 MED ORDER — CYCLOBENZAPRINE HCL 10 MG PO TABS: 10.0000 mg | ORAL_TABLET | Freq: Three times a day (TID) | ORAL | 0 refills | Status: AC | PRN

## 2024-01-28 MED ORDER — CYCLOBENZAPRINE HCL 10 MG PO TABS
5.0000 mg | ORAL_TABLET | Freq: Once | ORAL | Status: AC
  Administered 2024-01-28: 5 mg via ORAL
  Filled 2024-01-28: qty 1

## 2024-01-28 NOTE — ED Notes (Signed)
 Applied knee immobilizer to the right affected knee and had RN Izetta approve of this brace. Pt stated this brace felt comfortable and will allow pt to test out the crutches when discharged. Pt did inform this tech and RN he has used crutches in the past.

## 2024-01-28 NOTE — Discharge Instructions (Addendum)
 Your knee and hip x-ray are normal.  We have placed you in a knee immobilizer and encouraged you to remain in this until you can follow-up with orthopedics for further evaluation.  Nonweightbearing status until this appointment.  Alternate ibuprofen  and Tylenol as needed for pain and discomfort.  Keep the leg elevated is much as possible.  If any new or worsening symptoms occur please return to ED for further evaluation.
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
## 2024-02-01 ENCOUNTER — Telehealth: Payer: Self-pay

## 2024-02-01 NOTE — Telephone Encounter (Signed)
 Patient would like to establish care with a provider in the office, as he was previously a patient of Dorn Der, NP, but did not wish to follow him. Patient also requested a prescription for sertraline  (Zoloft ) 100 mg tablets. I informed the patient that he has refills available, and he stated he will be contacting [facility] as well.Please assist in scheduling new pt apt.
